# Patient Record
Sex: Female | Born: 1985 | Race: Black or African American | Hispanic: No | Marital: Single | Smoking: Former smoker
Health system: Southern US, Community
[De-identification: ages and names within clinical notes are randomized; demographics above are authoritative.]

## PROBLEM LIST (undated history)

## (undated) DIAGNOSIS — J189 Pneumonia, unspecified organism: Secondary | ICD-10-CM

## (undated) DIAGNOSIS — R87629 Unspecified abnormal cytological findings in specimens from vagina: Secondary | ICD-10-CM

## (undated) DIAGNOSIS — R519 Headache, unspecified: Secondary | ICD-10-CM

## (undated) DIAGNOSIS — D649 Anemia, unspecified: Secondary | ICD-10-CM

## (undated) DIAGNOSIS — M1990 Unspecified osteoarthritis, unspecified site: Secondary | ICD-10-CM

## (undated) HISTORY — DX: Anemia, unspecified: D64.9

## (undated) HISTORY — DX: Headache, unspecified: R51.9

## (undated) HISTORY — PX: KNEE SURGERY: SHX244

## (undated) HISTORY — DX: Unspecified abnormal cytological findings in specimens from vagina: R87.629

## (undated) HISTORY — DX: Pneumonia, unspecified organism: J18.9

## (undated) HISTORY — DX: Unspecified osteoarthritis, unspecified site: M19.90

---
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
## 2006-02-08 ENCOUNTER — Emergency Department (HOSPITAL_COMMUNITY): Admission: EM | Admit: 2006-02-08 | Discharge: 2006-02-09 | Payer: Self-pay | Admitting: Emergency Medicine

## 2006-03-31 ENCOUNTER — Emergency Department (HOSPITAL_COMMUNITY): Admission: EM | Admit: 2006-03-31 | Discharge: 2006-03-31 | Payer: Self-pay | Admitting: Emergency Medicine

## 2006-09-14 ENCOUNTER — Inpatient Hospital Stay (HOSPITAL_COMMUNITY): Admission: AD | Admit: 2006-09-14 | Discharge: 2006-09-15 | Payer: Self-pay | Admitting: Obstetrics & Gynecology

## 2006-09-16 ENCOUNTER — Inpatient Hospital Stay (HOSPITAL_COMMUNITY): Admission: AD | Admit: 2006-09-16 | Discharge: 2006-09-16 | Payer: Self-pay | Admitting: Obstetrics & Gynecology

## 2006-09-22 ENCOUNTER — Ambulatory Visit (HOSPITAL_COMMUNITY): Admission: AD | Admit: 2006-09-22 | Discharge: 2006-09-22 | Payer: Self-pay | Admitting: Obstetrics & Gynecology

## 2006-11-24 ENCOUNTER — Inpatient Hospital Stay (HOSPITAL_COMMUNITY): Admission: AD | Admit: 2006-11-24 | Discharge: 2006-11-24 | Payer: Self-pay | Admitting: Gynecology

## 2007-01-22 ENCOUNTER — Inpatient Hospital Stay (HOSPITAL_COMMUNITY): Admission: AD | Admit: 2007-01-22 | Discharge: 2007-01-22 | Payer: Self-pay | Admitting: Obstetrics

## 2007-04-28 ENCOUNTER — Inpatient Hospital Stay (HOSPITAL_COMMUNITY): Admission: AD | Admit: 2007-04-28 | Discharge: 2007-04-28 | Payer: Self-pay | Admitting: Obstetrics

## 2007-05-13 ENCOUNTER — Inpatient Hospital Stay (HOSPITAL_COMMUNITY): Admission: AD | Admit: 2007-05-13 | Discharge: 2007-05-15 | Payer: Self-pay | Admitting: Obstetrics

## 2008-03-24 ENCOUNTER — Emergency Department (HOSPITAL_COMMUNITY): Admission: EM | Admit: 2008-03-24 | Discharge: 2008-03-24 | Payer: Self-pay | Admitting: *Deleted

## 2008-03-25 ENCOUNTER — Emergency Department (HOSPITAL_COMMUNITY): Admission: EM | Admit: 2008-03-25 | Discharge: 2008-03-25 | Payer: Self-pay | Admitting: Emergency Medicine

## 2008-04-14 ENCOUNTER — Inpatient Hospital Stay (HOSPITAL_COMMUNITY): Admission: EM | Admit: 2008-04-14 | Discharge: 2008-04-19 | Payer: Self-pay | Admitting: *Deleted

## 2008-04-14 ENCOUNTER — Ambulatory Visit: Payer: Self-pay | Admitting: *Deleted

## 2008-04-14 ENCOUNTER — Emergency Department (HOSPITAL_COMMUNITY): Admission: EM | Admit: 2008-04-14 | Discharge: 2008-04-14 | Payer: Self-pay | Admitting: Emergency Medicine

## 2008-05-03 ENCOUNTER — Inpatient Hospital Stay (HOSPITAL_COMMUNITY): Admission: AD | Admit: 2008-05-03 | Discharge: 2008-05-03 | Payer: Self-pay | Admitting: Obstetrics & Gynecology

## 2008-07-11 ENCOUNTER — Ambulatory Visit (HOSPITAL_COMMUNITY): Admission: RE | Admit: 2008-07-11 | Discharge: 2008-07-11 | Payer: Self-pay | Admitting: Obstetrics

## 2008-09-18 ENCOUNTER — Inpatient Hospital Stay (HOSPITAL_COMMUNITY): Admission: AD | Admit: 2008-09-18 | Discharge: 2008-09-21 | Payer: Self-pay | Admitting: Obstetrics

## 2008-12-09 ENCOUNTER — Inpatient Hospital Stay (HOSPITAL_COMMUNITY): Admission: AD | Admit: 2008-12-09 | Discharge: 2008-12-12 | Payer: Self-pay | Admitting: *Deleted

## 2008-12-09 ENCOUNTER — Other Ambulatory Visit: Payer: Self-pay | Admitting: Emergency Medicine

## 2008-12-09 ENCOUNTER — Ambulatory Visit: Payer: Self-pay | Admitting: *Deleted

## 2009-08-08 IMAGING — US US OB DETAIL+14 WK
1 series · 14 of 28 positions shown · non-contrast
Comparison: none

OBSTETRICAL ULTRASOUND:
 This ultrasound exam was performed in the [HOSPITAL] Ultrasound Department.  The OB US report was generated in the AS system, and faxed to the ordering physician.  This report is also available in [REDACTED] PACS.

[Series 1: us ob detail +14 wk · 14 of 112 slices shown]
[im 5/112]
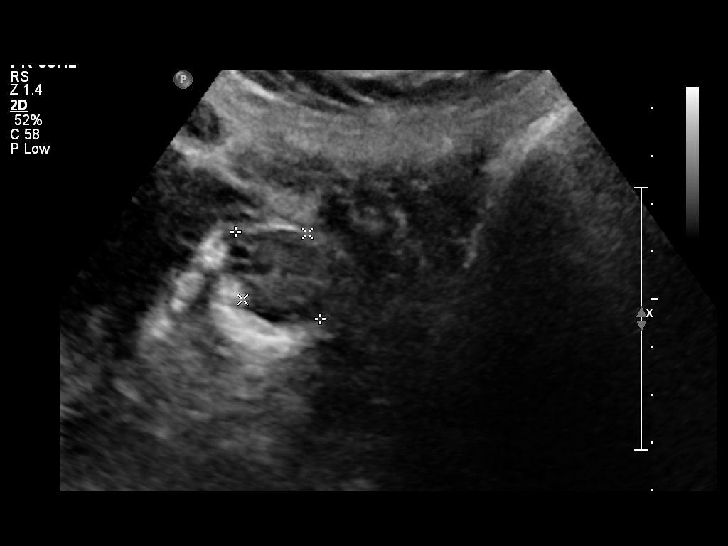
[im 13/112]
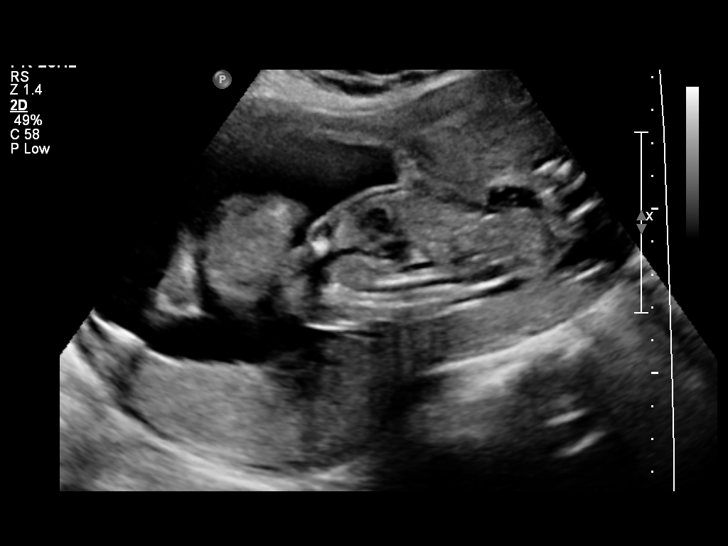
[im 21/112]
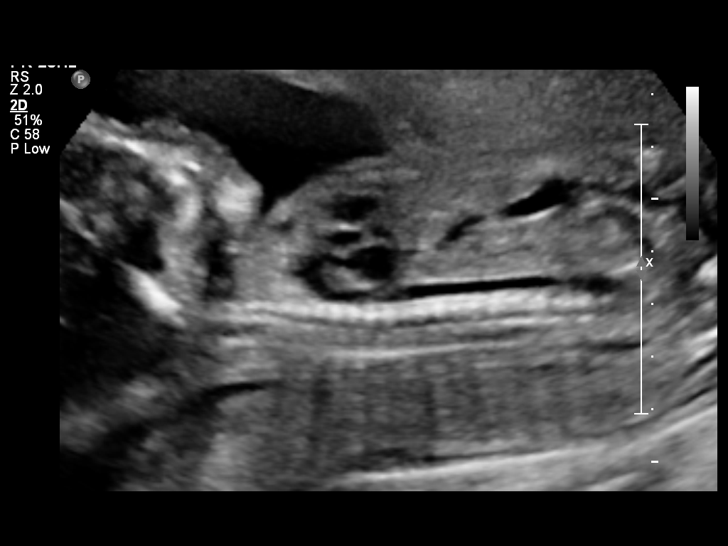
[im 29/112]
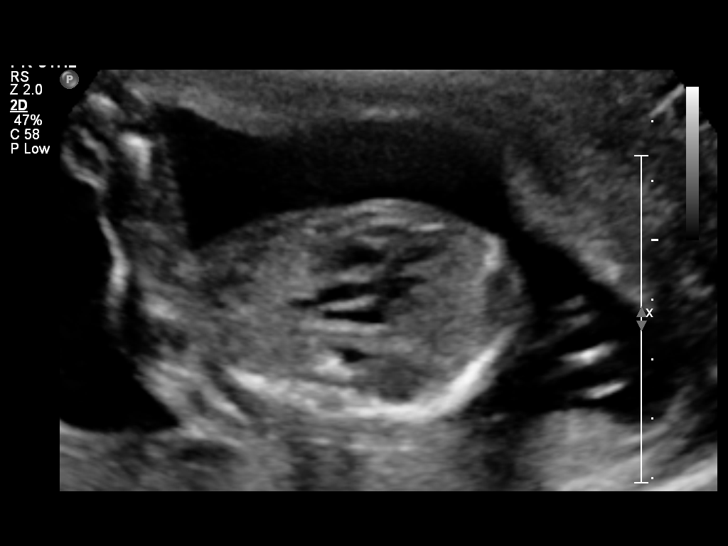
[im 38/112]
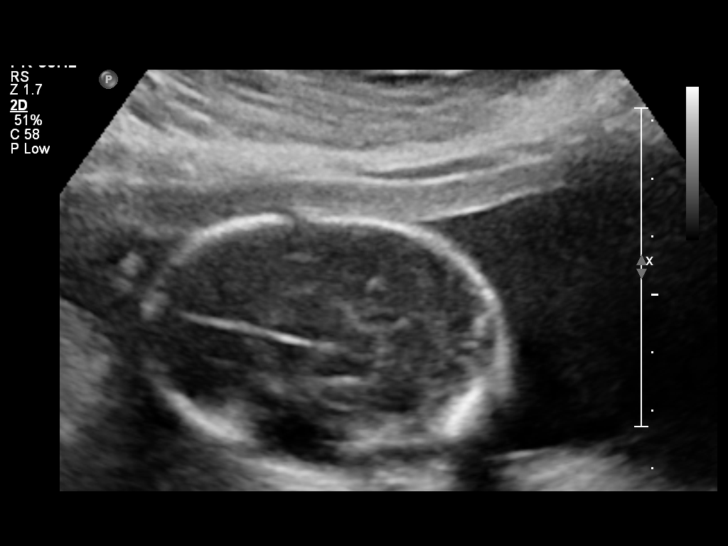
[im 46/112]
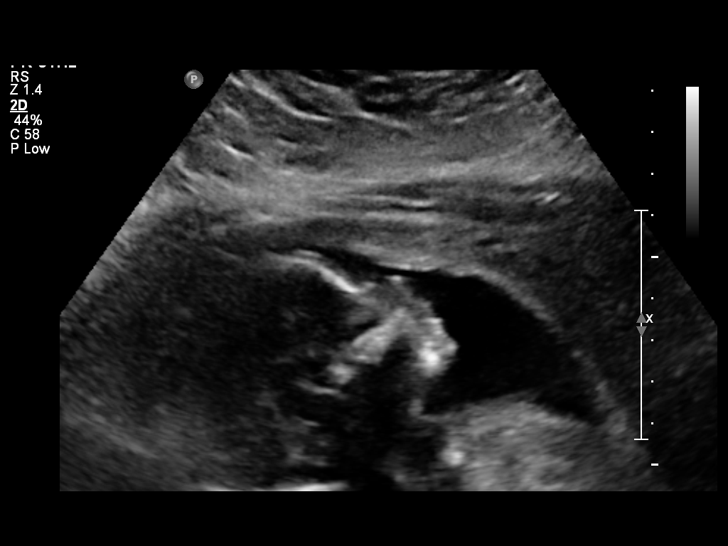
[im 54/112]
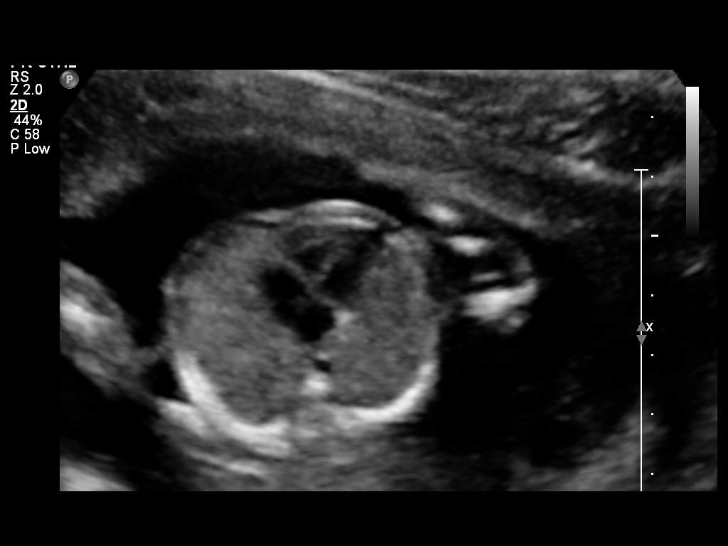
[im 62/112]
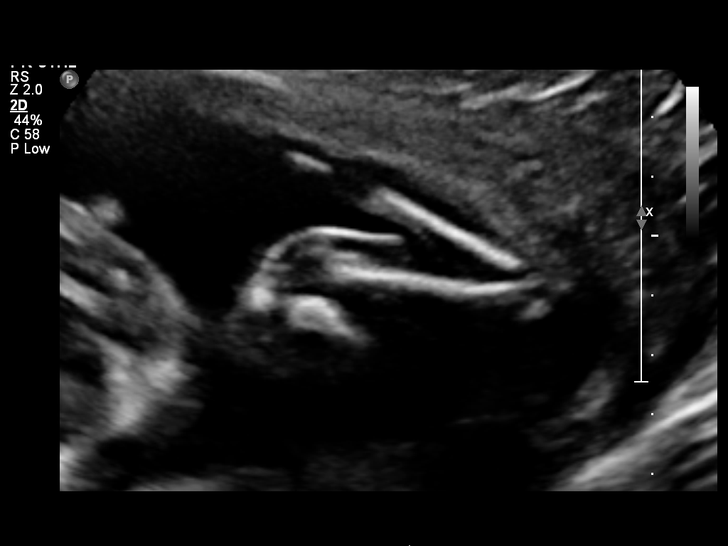
[im 70/112]
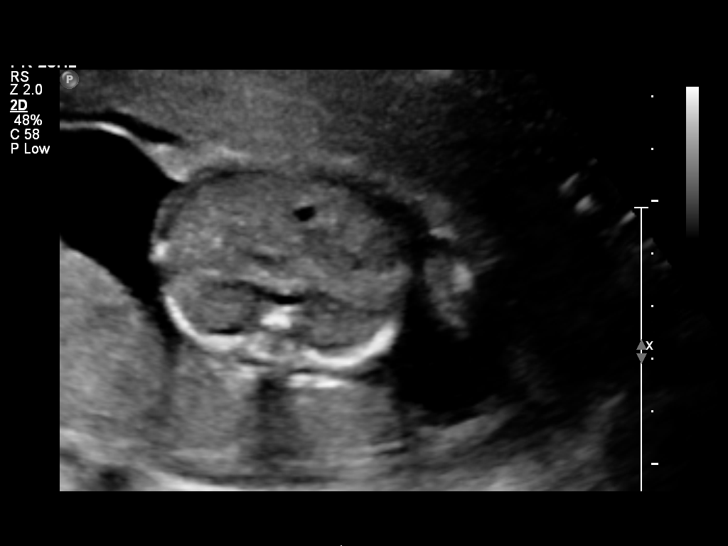
[im 79/112]
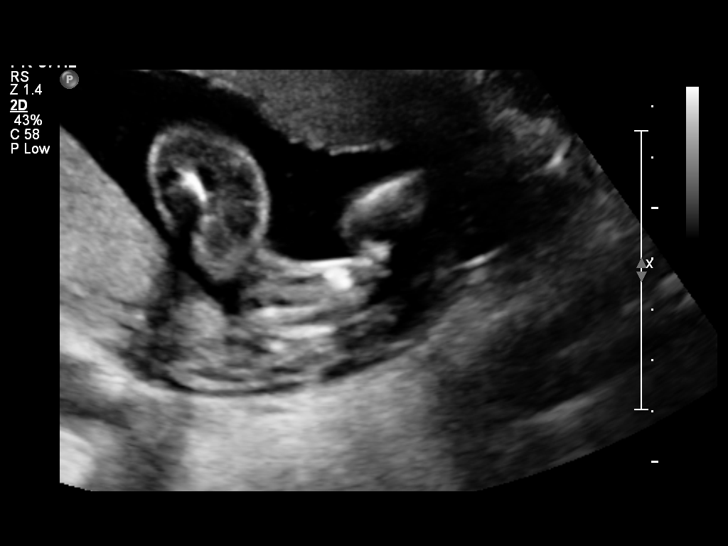
[im 87/112]
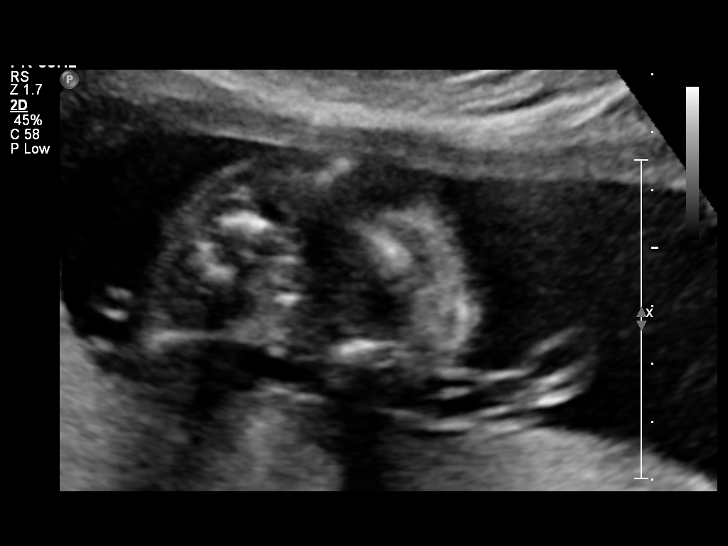
[im 95/112]
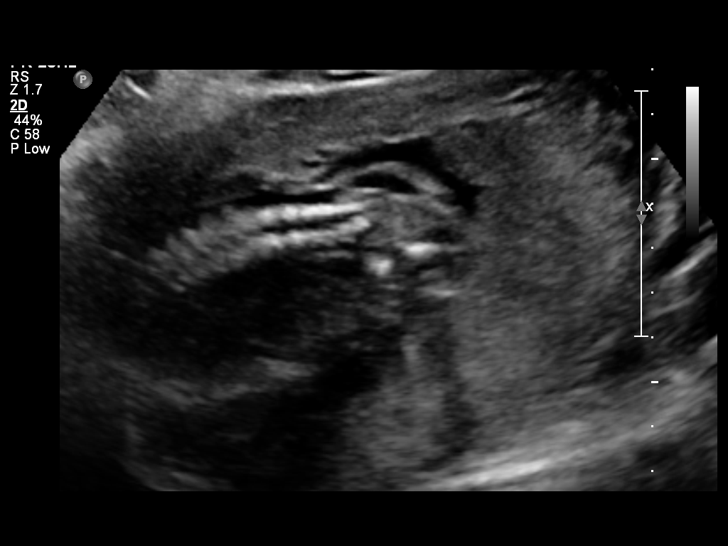
[im 103/112]
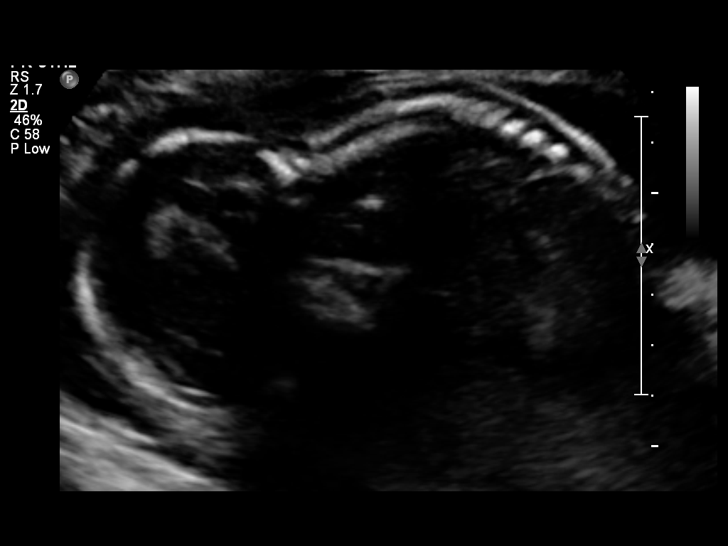
[im 112/112]
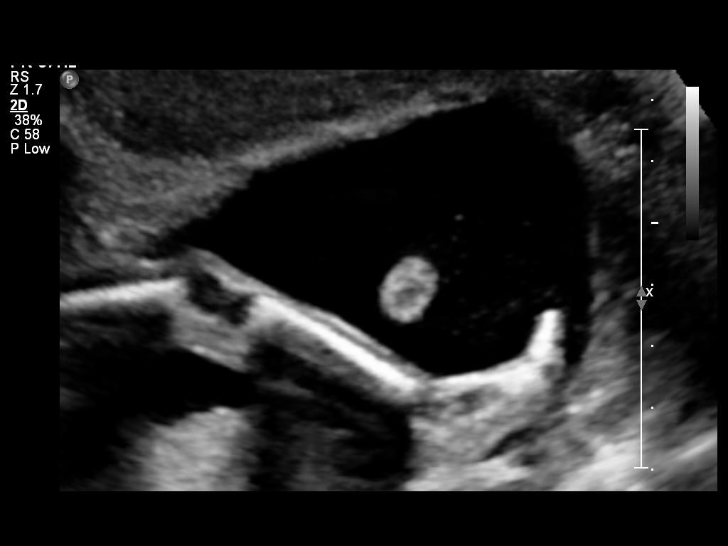

[14 of 28 positions shown; findings below may reference images not displayed]

IMPRESSION: See AS Obstetric US report.

## 2009-08-18 ENCOUNTER — Emergency Department (HOSPITAL_COMMUNITY): Admission: EM | Admit: 2009-08-18 | Discharge: 2009-08-18 | Payer: Self-pay | Admitting: Emergency Medicine

## 2009-11-20 ENCOUNTER — Emergency Department (HOSPITAL_COMMUNITY): Admission: EM | Admit: 2009-11-20 | Discharge: 2009-11-20 | Payer: Self-pay | Admitting: Emergency Medicine

## 2010-05-19 ENCOUNTER — Encounter: Payer: Self-pay | Admitting: Obstetrics

## 2010-07-16 LAB — URINALYSIS, ROUTINE W REFLEX MICROSCOPIC
Glucose, UA: NEGATIVE mg/dL
Hgb urine dipstick: NEGATIVE
Ketones, ur: 15 mg/dL — AB
Specific Gravity, Urine: 1.03 (ref 1.005–1.030)
Urobilinogen, UA: 0.2 mg/dL (ref 0.0–1.0)
pH: 6 (ref 5.0–8.0)

## 2010-07-16 LAB — WET PREP, GENITAL: Yeast Wet Prep HPF POC: NONE SEEN

## 2010-07-16 LAB — URINE MICROSCOPIC-ADD ON

## 2010-08-03 LAB — CBC
HCT: 40.4 % (ref 36.0–46.0)
MCHC: 34 g/dL (ref 30.0–36.0)
RDW: 14.9 % (ref 11.5–15.5)

## 2010-08-03 LAB — SALICYLATE LEVEL: Salicylate Lvl: <4 mg/dL (ref 2.8–20.0)

## 2010-08-03 LAB — RAPID URINE DRUG SCREEN, HOSP PERFORMED
Benzodiazepines: POSITIVE — AB
Cocaine: NOT DETECTED
Opiates: NOT DETECTED

## 2010-08-03 LAB — COMPREHENSIVE METABOLIC PANEL
ALT: 20 U/L (ref 0–35)
AST: 22 U/L (ref 0–37)
Alkaline Phosphatase: 78 U/L (ref 39–117)
BUN: 8 mg/dL (ref 6–23)
Calcium: 9.2 mg/dL (ref 8.4–10.5)
Chloride: 109 mEq/L (ref 96–112)
GFR calc Af Amer: >60 mL/min (ref >60)
GFR calc non Af Amer: >60 mL/min (ref >60)
Potassium: 4 mEq/L (ref 3.5–5.1)
Sodium: 138 mEq/L (ref 135–145)
Total Bilirubin: 0.7 mg/dL (ref 0.3–1.2)
Total Protein: 7.4 g/dL (ref 6.0–8.3)

## 2010-08-03 LAB — ACETAMINOPHEN LEVEL
Acetaminophen (Tylenol), Serum: 24.4 ug/mL (ref 10–30)
Acetaminophen (Tylenol), Serum: <10 ug/mL — ABNORMAL LOW (ref 10–30)

## 2010-08-06 LAB — RPR: RPR Ser Ql: NONREACTIVE

## 2010-08-06 LAB — CBC
Hemoglobin: 10.9 g/dL — ABNORMAL LOW (ref 12.0–15.0)
Hemoglobin: 12.7 g/dL (ref 12.0–15.0)
MCHC: 33.9 g/dL (ref 30.0–36.0)
Platelets: 165 10*3/uL (ref 150–400)
Platelets: 170 10*3/uL (ref 150–400)
WBC: 13.5 10*3/uL — ABNORMAL HIGH (ref 4.0–10.5)

## 2010-08-12 LAB — GC/CHLAMYDIA PROBE AMP, GENITAL
Chlamydia, DNA Probe: NEGATIVE
GC Probe Amp, Genital: NEGATIVE

## 2010-08-12 LAB — DIFFERENTIAL
Basophils Absolute: 0 10*3/uL (ref 0.0–0.1)
Basophils Relative: 0 % (ref 0–1)
Monocytes Absolute: 0.6 10*3/uL (ref 0.1–1.0)
Neutro Abs: 6.7 10*3/uL (ref 1.7–7.7)

## 2010-08-12 LAB — TYPE AND SCREEN: Antibody Screen: NEGATIVE

## 2010-08-12 LAB — URINALYSIS, ROUTINE W REFLEX MICROSCOPIC
Hgb urine dipstick: NEGATIVE
Ketones, ur: NEGATIVE mg/dL
Protein, ur: NEGATIVE mg/dL
Specific Gravity, Urine: 1.025 (ref 1.005–1.030)
Urobilinogen, UA: 1 mg/dL (ref 0.0–1.0)
pH: 6.5 (ref 5.0–8.0)

## 2010-08-12 LAB — URINE CULTURE: Colony Count: >=100000

## 2010-08-12 LAB — URINE MICROSCOPIC-ADD ON

## 2010-08-12 LAB — CBC
Hemoglobin: 12.1 g/dL (ref 12.0–15.0)
MCHC: 33.7 g/dL (ref 30.0–36.0)
RDW: 14.6 % (ref 11.5–15.5)

## 2010-08-12 LAB — RPR: RPR Ser Ql: NONREACTIVE

## 2010-08-12 LAB — WET PREP, GENITAL

## 2010-09-10 NOTE — H&P (Signed)
NAMEBRECKLYNN, JIAN             ACCOUNT NO.:  [id number]   MEDICAL RECORD NO.:  [id number]          PATIENT TYPE:  IPS   LOCATION:  0300                          FACILITY:  [facility]   PHYSICIAN:  Jasmine Pang, M.D. DATE OF BIRTH:  08/31/1985   DATE OF ADMISSION:  12/09/2008  DATE OF DISCHARGE:                       PSYCHIATRIC ADMISSION ASSESSMENT   IDENTIFYING INFORMATION/JUSTIFICATION FOR ADMISSION AND CARE:  This is a  25 year old single African American female.  The patient presented to  the ED at [facility] reporting that she was suicidal and had taken an  intentional overdose of Tylenol, 5 to 6 Tylenol's prior to coming to the  emergency department.  She also tried to inflict a laceration to her  right anterior wrist. This did not require any treatment other than  cleaning. She states that she and her mother had gotten into a verbal  altercation. She states that her mother told her she had 60 days to get  out and to take her children ages 52 months and 2 months with her. She  already receives in-house weekly counseling. This was due to the  depression noted after the birth of her infant daughter who is 68 months  old.   PAST PSYCHIATRIC HISTORY:  She was here with [place] once before 04/14/2009  to 04/19/2009. At that time, she had tried to strangle herself with a  belt. She had to be restrained. She felt hopeless after having an  argument on the phone with her baby's father and she was pregnant at  that time with the baby's due date of Sep 04, 2008.   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 She has never married.  Her children have different fathers.  She has a son who is 19 months and her daughter is now 2 months.  She  states that her family is watching her children and she receives  unemployment.   FAMILY HISTORY:  An uncle has alcohol issues.   MEDICAL HISTORY AND PRIMARY CARE PHYSICIAN:  She has no PCC. Her OB/GYN  is Dr.  Gaynell Face and she has no psychiatric follow-up. Dr. Gaynell Face  changed her from Prozac to Zoloft and she is currently prescribed Zoloft  50 mg p.o. daily.   DRUG ALLERGIES:  No known drug allergies.   PHYSICAL EXAMINATION:  GENERAL:  She is a well-developed, well-nourished  [place] female who appears her stated age of 50.  She is obese.  She is 5 foot 3 and weighs 284. VITAL SIGNS:  In the emergency room  showed that her temperature was 98.2 to 98.6.  Her pulse was 59-67,  respirations 18-20, blood pressure was 115/58 to 139/93.   LABORATORY DATA:  Her CBC had no abnormalities nor did her electrolytes.  She had no registered alcohol.  Her UDS was positive for benzodiazepines  as well as marijuana.   MENTAL STATUS EXAM:  Today she is calm.  She appears to have no acute  distress.  She was appropriately groomed and dressed. Her speech was  slow and soft.  Her mood was slightly anxious.  Her anxiety level was  felt to be bothered. Her thought processes were clear, rational and goal  oriented.  She has a plan to go live with her grandmother with the  children to get out of conflict with her mother and she wants to be able  to start school as scheduled on Tuesday.  Judgment and insight are  intact at this time.  Concentration and memory are present and  intelligence is at least average.  She specifically denies being  suicidal or homicidal.  She denies any auditory or visual  hallucinations.   DIAGNOSES:  AXIS I:  Substance-induced mood disorder, THC abuse.  AXIS II:  Rule out personality disorder.  AXIS III:  No known health problems other than obesity.  AXIS IV:  Issues with primary support group, education, occupational,  housing, economic issues.  AXIS V:  32.   PLAN:  Increase her Zoloft to 100 mg p.o. daily. We will have the case  manager to get a family session, hopefully with her mother and her  grandmother, as well as touching base with whoever her in-home therapist   is and she will be allowed to take her birth control pills from home.  She may need a substance abuse assessment.   ESTIMATED LENGTH OF STAY:  Is 2-3 days.      Mickie Leonarda Salon, P.A.-C.      Jasmine Pang, M.D.  Electronically Signed    MD/MEDQ  D:  12/10/2008  T:  12/10/2008  Job:  [id number]

## 2010-09-10 NOTE — H&P (Signed)
**Note Mckenzie Miller** Mckenzie Miller, SHIPP NO.:  [id number]   MEDICAL RECORD NO.:  [id number]          PATIENT TYPE:  IPS   LOCATION:  0304                          FACILITY:  [facility]   PHYSICIAN:  Jasmine Pang, M.D. DATE OF BIRTH:  Mar 22, 1986   DATE OF ADMISSION:  04/14/2008  DATE OF DISCHARGE:                       PSYCHIATRIC ADMISSION ASSESSMENT   IDENTIFYING INFORMATION:  This is a 25 year old African American female.  This is a voluntary admission.  Mckenzie Miller is single.   HISTORY OF THE PRESENT ILLNESS:  First [facility] admission and first inpatient  psychiatric admission for this 25 year old who presents after attempting  to strangle herself with a belt at home.  EMS was called.  Mckenzie Miller had to be  restrained.  Mckenzie Miller attempted to strangle herself using a belt in front of  her family members.  Today Mckenzie Miller says that Mckenzie Miller has been quite tearful and  having suicidal thoughts, felt hopeless after having an argument on the  phone with her baby's father.  Mckenzie Miller is in a first trimester of her  pregnancy, due Sep 04, 2008.  Mckenzie Miller also has an 46-month-old child at home  by a previous relationship.  Most recently, Mckenzie Miller fears that her baby's  father has been unfaithful and is not interested in her.  Mckenzie Miller loves him,  wants to continue the relationship.  He has been unclear on what his  feelings are about the relationship and the patient feels Mckenzie Miller may not be  able to go on without him.   PAST PSYCHIATRIC HISTORY:  First inpatient psychiatric admission.  Mckenzie Miller  received no current outpatient treatment.  Mckenzie Miller has a history of being  seen in the past for a period of time at [facility]  where they were evaluating her for bipolar disorder but Mckenzie Miller never  received a specific diagnosis.  Mckenzie Miller is not under any current care.  Denies previous trials of medications.  Mckenzie Miller denies ever having learning  disability or brain injury.  Says that Mckenzie Miller has tried to strangle or harm  herself several times in the past and did  experience some auditory  hallucinations as a teen.  Mckenzie Miller is currently using marijuana occasionally  but does not use it on a regular basis, last use was about a week ago.  Mckenzie Miller denies other substance abuse.   SOCIAL HISTORY:  Single female.  Mckenzie Miller is currently living with her mother  and her 34-month-old son.  Mckenzie Miller is enrolled in [employer] [facility] [employer] and  struggling to take her prerequisite courses.  Endorses a lot of  struggles in the relationship with her boyfriend, believes that he is  seeing someone else.  He in turn believes that Mckenzie Miller has cheated on him.  Wants to be a good mother to her 68-month-old, feels that her mother is  supportive.   FAMILY HISTORY:  Mckenzie Miller denies a family history of mental illness or  substance abuse.   MEDICAL HISTORY:   MEDICAL PROBLEMS:  1. Urinary tract infection diagnosed in the emergency room.  2. First trimester pregnancy.   PAST MEDICAL HISTORY:  Osteoarthritis of her right knee.   MEDICATIONS:  1. Prenatal vitamin 1 daily.  2. Mckenzie Miller has been prescribed Augmentin for her urinary tract infection      500 mg p.o. t.i.d. x7 days.   DRUG ALLERGIES:  NO KNOWN DRUG ALLERGIES.   DIAGNOSTIC STUDIES:  Remarkable for urinalysis positive for WBCs 21 to  50 per high-powered field and a urine drug screen positive for  marijuana.  Chemistry and CBC within normal limits.   MENTAL STATUS EXAM:  Fully alert female, cooperative.  Subdued mood.  Blunted affect.  Appears sad but oriented x4.  Cooperative.  Polite.  Speech is normal.  Gives a fairly coherent history.  Complicated  relationship with the boyfriend.  Insight is adequate.  Mood is  depressed.  Thought process logical, coherent, goal directed.  No  evidence of internal distractions or psychosis today.  No homicidal  thought.  Wants to maintain a relationship with the boyfriend but fears  that it is going to fail.  Immediate, recent, remote memory are intact.  Cognition fully intact.  AXIS I:  Depressive  disorder, NOS.  AXIS II:  Deferred.  AXIS III:  First trimester pregnancy, UTI.  AXIS IV:  Severe issues with relationship conflict.  AXIS V:  Current 44, past year not known.   PLAN:  Voluntarily admit her for stabilization.  We have to get a family  session with her boyfriend first hoping that they can talk things out  with the counselor and then possibly after that a family session with  her mother with whom Mckenzie Miller lives.  No medications at this time.  We will  continue to evaluate for the possible need for an antidepressant.      Margaret A. Lorin Picket, N.P.      Jasmine Pang, M.D.  Electronically Signed    MAS/MEDQ  D:  04/17/2008  T:  04/17/2008  Job:  [id number]

## 2010-09-10 NOTE — Discharge Summary (Signed)
Mckenzie Miller, Mckenzie Miller             ACCOUNT NO.:  [id number]   MEDICAL RECORD NO.:  [id number]          PATIENT TYPE:  IPS   LOCATION:  0300                          FACILITY:  [facility]   PHYSICIAN:  Jasmine Pang, M.D. DATE OF BIRTH:  03-May-1985   DATE OF ADMISSION:  12/09/2008  DATE OF DISCHARGE:  12/12/2008                               DISCHARGE SUMMARY   IDENTIFICATION:  This is a 25 year old single African American female  who was admitted on a voluntary basis on December 09, 2008.   HISTORY OF PRESENT ILLNESS:  The patient presented to the ED at [facility] reporting that she was suicidal and had taken an intentional  overdose of 5-6 Tylenol prior to coming to the emergency department.  She also tried to inflict a laceration to her right anterior wrist.  This did not require any treatment other than cleaning.  She states that  she and her mother at gotten into her verbal altercation.  She states  that her mother told her she had 60 days to get out of the house and  take her children, aged 24 months and 2 months.  She already received in-  house weekly counseling.  This was due to the depression noted after the  birth of her infant daughter who was 67 months old.  She was here with [place]  once before April 14, 2008, to April 19, 2008.  At that time, she  had tried to strangle herself with a belt.  She had to be restrained.  She felt hopeless after having an argument on the phone with her baby's  father and she was pregnant at the time with the baby's due date of Sep 04, 2008.  For further admission information, see psychiatric admission  assessment.  Initially, she was given an axis I diagnosis of substance-  induced mood disorder and THC abuse.  Her on axis III, she was given a  diagnosis of no health problems.   PHYSICAL FINDINGS:  There were no acute physical or medical problems  noted.  Her complete physical exam was done in the emergency department  and reviewed by our  PA.   LABORATORY DATA:  Her CBC had no abnormalities nor did her electrolyte.  She had no registered alcohol.  Her UDS was positive for benzodiazepines  as well as marijuana.   HOSPITAL COURSE:  Upon admission, the patient was continued on her own  birth control pills daily.  She was also started on Benadryl 50 mg p.o.  q.h.s.  In individual sessions, she was initially casually dressed with  fair eye contact.  There was psychomotor retardation.  Speech was soft  and slow.  Mood was depressed and anxious.  She denied suicidal  ideation.  There was no evidence of psychosis or thought disorder.  It  was decided to increase her Zoloft from 50 mg daily (her home dose) to  100 mg daily.  As hospitalization progressed, the patient had a family  session with her grandmother and grandfather.  They were very  supportive.  They stated that the patient's mother was most  likely not  to change her feelings or actions any time soon.  Although the patient's  grandparents wanted her to come and stay with them with her 2 children  as long as needed.  She agreed to this and felt comfortable.  Moving  in with them though she was somewhat worried that her mother was going  to react with anger.  On December 11, 2008, she was still having some  anxiety, though she was less depressed.  She was started on Neurontin  100 mg now then 100 mg q.4 h. p.r.n. anxiety and she continued to talk  about the conflict with her mother.  She was very upset about this, so  she was aware that her mother would probably not change.  She felt good  about the plans to go home to live with her grandparents and take her  children.  On December 12, 2008, mental status had improved.  She was  neatly and casually dressed with good eye contact.  Speech was normal  rate and flow.  Psychomotor activity was within normal limits.  Mood was  less depressed and less anxious.  Affect was consistent with mood.  There was no suicidal or homicidal  ideation.  No thoughts of self-  injurious behavior.  No auditory or visual hallucinations.  No paranoia  or delusions.  Thoughts were logical and goal directed.  Thought  content, no predominant theme.  Cognitive grossly intact.  Insight good.  Judgment good.  Impulse control good.  The patient wanted to go home  today and was felt to be safe for discharge.   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  Axis II:  None  Axis III:  No known health problems.  Axis IV:  Severe (issues with primary support group, education,  occupational, housing, and economic issues).  Axis V:  Global assessment of functioning was 50 upon discharge.  Global  assessment of functioning was 32 upon admission.  Global assessment of  functioning highest past year was 70.   DISCHARGE PLAN:  There were no specific activity level or dietary  restrictions.   POST HOSPITAL CARE PLAN:  The patient will go to the [facility] on  December 15, 2008, at 1:30 p.m.   DISCHARGE MEDICATIONS:  1. The patient is to continue her oral contraceptives as prescribed.  2. She is also on Zoloft 100 mg daily.  3. Ambien 10 mg at bedtime if needed for sleep.  4. Neurontin 100 mg every 4 hours as needed for anxiety.      Jasmine Pang, M.D.  Electronically Signed     BHS/MEDQ  D:  12/12/2008  T:  12/13/2008  Job:  [id number]

## 2010-09-13 NOTE — Discharge Summary (Signed)
Mckenzie Miller, Mckenzie Miller             ACCOUNT NO.:  [id number]   MEDICAL RECORD NO.:  [id number]          PATIENT TYPE:  IPS   LOCATION:  0304                          FACILITY:  [facility]   PHYSICIAN:  Jasmine Pang, M.D. DATE OF BIRTH:  12/04/1985   DATE OF ADMISSION:  04/14/2008  DATE OF DISCHARGE:  04/19/2008                               DISCHARGE SUMMARY   IDENTIFYING INFORMATION:  This is a 25 year old single African American  female who was admitted on a voluntary basis on April 14, 2008.   HISTORY OF PRESENT ILLNESS:  This is the first [facility] admission and first  inpatient psychiatric admission for this 25 year old, who presents after  attempting to strangle herself with a belt at home.  EMS was called.  She had to be restrained.  She attempted to strangle herself using a  belt in front of her family members.  Today, she says that she has been  quite tearful and having suicidal thoughts.  She felt hopeless after  having an argument on the phone with her baby's father.  She is in the  first trimester of her pregnancy, due Sep 04, 2008.  She also has a 74-  month-old child at home by a previous relationship.  Most recently, she  fears that her baby's father has been unfaithful and was not interested  in her.  She states she loves him and wants to continue the  relationship.  He has been unclear on what his feelings are about the  relationship and the patient states she may not be able to go on without  him.   PAST PSYCHIATRIC HISTORY:  This is the first psychiatric admission for  the patient.  She received no current outpatient treatment.  She has a  history of being seen in the past for a period of time at [facility] where they were evaluated her for bipolar  disorder, but she never received a diagnosis.  She is not under any  current care.  She denies previous trials of medications.  She denies  ever having this learning disability or brain injury.  She  says that she  is tried to strangle her or harm herself several times in the past and  did experience some auditory hallucinations as a teen.  She is currently  using marijuana occasionally, but does not use it on a regular basis.  Last use was about a week ago.  She denies other substance abuse.   FAMILY HISTORY:  The patient denies family history of mental illness or  substance abuse.   DRUG AND ALCOHOL HISTORY:  As above.  She is currently using marijuana  occasionally, but not on a regular basis.  She denies other substance  abuse.   MEDICAL PROBLEMS:  Urinary tract infection diagnosed in the emergency  room, first trimester pregnancy.   PAST MEDICAL HISTORY:  Osteoarthritis of the right knee.   MEDICATIONS:  1. Prenatal vitamins 1 daily.  2. She has been prescribed Augmentin for her urinary tract infection      500 mg p.o. t.i.d. for 7 days.  DRUG ALLERGIES:  No known drug allergies.   DIAGNOSTIC STUDIES:  Remarkable for urinalysis positive for WBCs of 21  to 50 per high-power field and urine drug screen was positive for  marijuana.  Chemistry panel was within normal limits.  CBC was within  normal limits.   HOSPITAL COURSE:  Upon admission, the patient was continued on Augmentin  500 mg p.o. t.i.d. and prenatal vitamins 1 daily.  She was also started  on Prozac 20 mg p.o. q.a.m. and Claritin 10 mg p.o. q.h.s.  She was  started on Monistat cream due to a probable yeast infection.  In  individual sessions, the patient was friendly and cooperative.  She was  depressed and tearful.  There was a psychomotor retardation.  She was  awaiting a family session with baby's father.  She wants to continue the  relationship with him, but fears he will reject her.  She had been  hearing angry voices telling her to hurt people, but no auditory  hallucinations since admission.  She denies homicidal ideation.  On  April 17, 2008, she was still depressed and anxious.  The Prozac was   started as indicated above.  Sleep was difficulty falling asleep.  The  patient was anxious to go home soon and being with her 66-month-old  child with her mother.  On April 19, 2008, the patient and the  patient's mother met for family session.  The patient stated that she  felt like the weight of the world was on her shoulders and she had just  not been able to cope anymore.  She states she has never acted on her  impulses to hurt herself in the past.  She states that while being here,  she has learned positive coping mechanisms for dealing with her stress  and depression.  She reports putting pressure on her family being  unemployed and having no income from her 46-month-old baby's father.  The patient's mother wanted her to discontinue the relationship with the  current baby's father.  The patient stated I should not be in this  relationship, but I am confused.  The patient's mother begged the  patient not to get pregnant again because she is providing for a number  of family members just on her income.  The patient's boyfriend did not  provide for them financially.  Mother would like to see the patient take  care of herself and her baby's, focus slowly on them and leave this man  alone.  The patient reported feeling ready for discharge.  Her mental  status had improved.  Mood was less depressed, less anxious.  Affect  consistent with mood.  There was no suicidal or homicidal ideation.  No  thoughts of self-injurious behavior.  No auditory or visual  hallucinations.  No paranoia or delusions.  Thoughts were logical and  goal-directed.  Thought content still ruminating about her baby's  father.  Cognitive was grossly intact.  Insight fair.  Judgment fair.  Impulse control good.  It was felt the patient was safe for discharge  today.   DISCHARGE DIAGNOSES:  Axis I:  Depressive disorder, not otherwise  specified.  Axis II:  None.  Axis III:  First trimester pregnancy, urinary tract  infection.  Axis IV:  Severe (issues with relationship, conflict of single mother  with no income, burden of psychiatric illness, burden of current  pregnancy).  Axis V:  Global assessment of functioning was 50 at the time of  discharge.  GAF was 44 upon admission.  GAF highest past year was 60.   DISCHARGE PLANS:  There was no specific activity level or dietary  restrictions.   POSTHOSPITAL CARE PLANS:  The patient was referred to [facility] on  January 6th at 1:30 p.m.  She is also referred to [facility] for  counseling.  She was also referred to [facility].  She is to see  her doctor as they advised.   DISCHARGE MEDICATIONS:  1. Prozac 20 mg daily.  2. Monistat application daily for 6 more days.  3. Augmentin 500 mg three times a day for 2 more days.      Jasmine Pang, M.D.  Electronically Signed     BHS/MEDQ  D:  05/23/2008  T:  05/24/2008  Job:  [id number]

## 2010-09-13 NOTE — Op Note (Signed)
[facility]  Patient:    Mckenzie Miller, Mckenzie Miller                    MRN: [id number] Proc. Date: 04/24/00 Adm. Date:  [id number] Attending:  Teena Dunk                           Operative Report  PREOPERATIVE DIAGNOSIS: 1. Lateral meniscal tear, right knee. 2. Osteoarthritis grade 3, medial compartment,.  POSTOPERATIVE DIAGNOSIS:  OPERATION PERFORMED: 1. Partial lateral meniscectomy. 2. Debridement chondroplasty medial compartment.  SURGEON:  Sharlot Gowda., M.D.  ANESTHESIA:  General.  DESCRIPTION OF PROCEDURE:  The patient was noted to have morbid obesity at age 25.  She had arthroscopy from a superiomedial, inferolateral portal. Systematic inspection of the knee showed that the patient had some mild softening of the articular surfaces of the patellofemoral joint.  There were some articular loose bodies removed from the knee with debridement with a shaver followed by inspection of the medial side of the knee which showed a rather extensive grade 3 lesion of the medial femoral condyle.  It was unusual that the lesion actually began more along the edge of the condyle abutting the meniscus and the more central or the more lateral portion of the condyle, the medial condyle was spared.  It was estimated by about 3 x 4 cm area was involved.  The meniscus itself was not torn medially fortunately and there was some corresponding chondromalacia just inside of the medial meniscus which was debrided and estimated to be early grade 2 and grade 3 changes.  Certainly some significant portion of the grade 3 lesion on the femur did not abut on the articular surface of the tibia proper.  The ACL PCL were normal.  The lateral compartment showed minor fissuring.  Small punctate tear of the posterior horn of the meniscus requiring radial tear required resection about 10 to 15% of the meniscal substance with a basket forceps, motorized  shaving instruments.  Debridement was carried out followed by draining the knee free of fluid.  Portals and joint infiltrated with Marcaine and 4 mg of morphine additional 10 cc for a total of 30 cc 0.5% Marcaine infiltrated in the knee and lightly compressive sterile dressing applied and taken to the recovery in stable condition. DD:  04/24/00 TD:  04/24/00 Job: [id number]

## 2011-01-16 LAB — CBC
HCT: 33.3 — ABNORMAL LOW
Hemoglobin: 11.3 — ABNORMAL LOW
MCHC: 34.1
MCV: 89.9
Platelets: 185
Platelets: 187
RDW: 14.2
WBC: 22.7 — ABNORMAL HIGH

## 2011-01-16 LAB — COMPREHENSIVE METABOLIC PANEL
ALT: 16
AST: 24
Albumin: 1.9 — ABNORMAL LOW
Albumin: 2.6 — ABNORMAL LOW
Alkaline Phosphatase: 137 — ABNORMAL HIGH
BUN: 5 — ABNORMAL LOW
Calcium: 9.6
Chloride: 106
Creatinine, Ser: 0.77
GFR calc Af Amer: >60
Glucose, Bld: 148 — ABNORMAL HIGH
Potassium: 3.9
Sodium: 134 — ABNORMAL LOW
Total Bilirubin: 0.5

## 2011-01-16 LAB — URIC ACID: Uric Acid, Serum: 5.7

## 2011-01-16 LAB — RPR: RPR Ser Ql: NONREACTIVE

## 2011-01-16 LAB — MAGNESIUM: Magnesium: 5 — ABNORMAL HIGH

## 2011-01-28 LAB — WOUND CULTURE

## 2011-01-31 LAB — RAPID URINE DRUG SCREEN, HOSP PERFORMED
Barbiturates: NOT DETECTED
Benzodiazepines: NOT DETECTED

## 2011-01-31 LAB — POCT I-STAT, CHEM 8
Chloride: 110 mEq/L (ref 96–112)
Creatinine, Ser: 0.9 mg/dL (ref 0.4–1.2)
HCT: 36 % (ref 36.0–46.0)
Hemoglobin: 12.2 g/dL (ref 12.0–15.0)
Potassium: 3.6 mEq/L (ref 3.5–5.1)
Sodium: 138 mEq/L (ref 135–145)

## 2011-01-31 LAB — COMPREHENSIVE METABOLIC PANEL
ALT: 18
AST: 20
Albumin: 2.3 — ABNORMAL LOW
Alkaline Phosphatase: 155 — ABNORMAL HIGH
CO2: 22
Chloride: 109
GFR calc Af Amer: >60
Potassium: 3.8
Total Bilirubin: 0.5

## 2011-01-31 LAB — CBC
Hemoglobin: 12 g/dL (ref 12.0–15.0)
MCHC: 33.5 g/dL (ref 30.0–36.0)
Platelets: 215 10*3/uL (ref 150–400)
Platelets: 191
RBC: 4.23
RDW: 14.1 % (ref 11.5–15.5)
WBC: 5.7

## 2011-01-31 LAB — URINE MICROSCOPIC-ADD ON

## 2011-01-31 LAB — URINALYSIS, ROUTINE W REFLEX MICROSCOPIC
Bilirubin Urine: NEGATIVE
Glucose, UA: NEGATIVE mg/dL
Hgb urine dipstick: NEGATIVE
Specific Gravity, Urine: 1.022 (ref 1.005–1.030)
Urobilinogen, UA: 1 mg/dL (ref 0.0–1.0)
pH: 7.5 (ref 5.0–8.0)

## 2011-01-31 LAB — DIFFERENTIAL
Basophils Absolute: 0.1 10*3/uL (ref 0.0–0.1)
Basophils Relative: 1 % (ref 0–1)
Lymphocytes Relative: 11 % — ABNORMAL LOW (ref 12–46)
Monocytes Absolute: 0.4 10*3/uL (ref 0.1–1.0)
Neutro Abs: 7.1 10*3/uL (ref 1.7–7.7)
Neutrophils Relative %: 82 % — ABNORMAL HIGH (ref 43–77)

## 2011-01-31 LAB — ETHANOL: Alcohol, Ethyl (B): <5 mg/dL (ref 0–10)

## 2011-01-31 LAB — PREGNANCY, URINE: Preg Test, Ur: POSITIVE

## 2011-02-06 LAB — URINALYSIS, ROUTINE W REFLEX MICROSCOPIC
Bilirubin Urine: NEGATIVE
Glucose, UA: NEGATIVE
Hgb urine dipstick: NEGATIVE
Ketones, ur: NEGATIVE
Nitrite: NEGATIVE
Protein, ur: NEGATIVE
Specific Gravity, Urine: 1.025
Urobilinogen, UA: 1
pH: 6.5

## 2011-02-06 LAB — WET PREP, GENITAL: Clue Cells Wet Prep HPF POC: NONE SEEN

## 2011-02-10 LAB — URINALYSIS, ROUTINE W REFLEX MICROSCOPIC
Bilirubin Urine: NEGATIVE
Nitrite: NEGATIVE
Specific Gravity, Urine: 1.025
Urobilinogen, UA: 1

## 2011-02-10 LAB — URINE MICROSCOPIC-ADD ON

## 2011-02-10 LAB — WET PREP, GENITAL

## 2011-08-06 ENCOUNTER — Emergency Department (INDEPENDENT_AMBULATORY_CARE_PROVIDER_SITE_OTHER)
Admission: EM | Admit: 2011-08-06 | Discharge: 2011-08-06 | Disposition: A | Discharge status: Home / Self Care | Payer: Self-pay | Source: Home / Self Care | Attending: Family Medicine | Admitting: Family Medicine

## 2011-08-06 ENCOUNTER — Emergency Department (INDEPENDENT_AMBULATORY_CARE_PROVIDER_SITE_OTHER): Payer: Self-pay

## 2011-08-06 ENCOUNTER — Encounter (HOSPITAL_COMMUNITY): Payer: Self-pay | Admitting: *Deleted

## 2011-08-06 DIAGNOSIS — S40012A Contusion of left shoulder, initial encounter: Secondary | ICD-10-CM

## 2011-08-06 DIAGNOSIS — S40019A Contusion of unspecified shoulder, initial encounter: Secondary | ICD-10-CM

## 2011-08-06 MED ORDER — TRAMADOL HCL 50 MG PO TABS: 50.0000 mg | ORAL_TABLET | Freq: Four times a day (QID) | ORAL | Status: AC | PRN

## 2011-08-06 MED ORDER — IBUPROFEN 600 MG PO TABS: 600.0000 mg | ORAL_TABLET | Freq: Three times a day (TID) | ORAL | Status: AC

## 2011-08-06 NOTE — ED Notes (Signed)
Pt slipped on wet floor fell hit left shoulder on floor remains with pain per pt unable to lift arm due to pain

## 2011-08-06 NOTE — Discharge Instructions (Signed)
There is no signs of fracture or bone injury injure her shoulder x-rays. Take the prescribed medications as instructed and started doing shoulder rehabilitation exercises once her pain improves. Followup with your primary care provider if persistent pain after 1 week or you can followup with the orthopedist Dr. Lurlean Leyden provided above.

## 2011-08-07 NOTE — ED Provider Notes (Signed)
History     CSN: [id number]  Arrival date & time 08/06/11  1442   First MD Initiated Contact with Patient 08/06/11 1609      Chief Complaint  Patient presents with  . Fall  . Shoulder Pain  . Shoulder Injury    (Consider location/radiation/quality/duration/timing/severity/associated sxs/prior treatment) HPI Comments: 26 y/o Obese female no significant PMH. Here c/o left shoulder pain after a fall yesterday. States she slipped on wet floor landing on her left shoulder. Pain diffuse affecting anterior and posterior shoulder, worse with arm elevation. Denies hitting her head or LOC. No seizures, syncope or palpitations.   Patient is a 26 y.o. female presenting with shoulder injury.  Shoulder Injury Pertinent negatives include no chest pain and no shortness of breath.    History reviewed. No pertinent past medical history.  History reviewed. No pertinent past surgical history.  History reviewed. No pertinent family history.  History  Substance Use Topics  . Smoking status: Current Everyday Smoker  . Smokeless tobacco: Not on file  . Alcohol Use: Yes    OB History    Grav Para Term Preterm Abortions TAB SAB Ect Mult Living                  Review of Systems  Constitutional: Negative for fever and chills.  HENT: Negative for facial swelling and neck pain.   Respiratory: Negative for cough and shortness of breath.   Cardiovascular: Negative for chest pain, palpitations and leg swelling.  Musculoskeletal: Negative for back pain.       Left shoulder pain as per HPI  Skin: Negative for rash.  All other systems reviewed and are negative.    Allergies  Review of patient's allergies indicates no known allergies.  Home Medications   Current Outpatient Rx  Name Route Sig Dispense Refill  . IBUPROFEN 600 MG PO TABS Oral Take 1 tablet (600 mg total) by mouth 3 (three) times daily. 30 tablet 0  . TRAMADOL HCL 50 MG PO TABS Oral Take 1 tablet (50 mg total) by mouth every 6  (six) hours as needed for pain. 15 tablet 0    BP 127/84  Pulse 89  Temp 98.7 F (37.1 C)  Resp 18  SpO2 99%  LMP 07/16/2011  Physical Exam  Nursing note and vitals reviewed. Constitutional: She is oriented to person, place, and time. She appears well-developed and well-nourished. No distress.  HENT:  Head: Normocephalic and atraumatic.  Right Ear: External ear normal.  Left Ear: External ear normal.  Eyes: EOM are normal. Pupils are equal, round, and reactive to light.  Neck: Normal range of motion. Neck supple.       Tenderness with palpation over left trapezium muscle. Negative Spurling test.  Cardiovascular: Normal rate, regular rhythm and normal heart sounds.   Pulmonary/Chest: Effort normal and breath sounds normal.  Musculoskeletal:       Left shoulder no obvious deformity. Clavicle appears intact. Tender to palpation diffusely. Focal pain with pal pation over [facility] joint as well. Pain with adduction and abduction. Able to supinate and pronate left arm with discomfort at 90 degrees. No bruising or focal tenderness over humerus. Left arm and hand neurovascularly intact.  Neurological: She is alert and oriented to person, place, and time. She has normal reflexes.  Skin: No rash noted.       No bruising, ecchymosis or lacerations.    ED Course  Procedures (including critical care time)  Labs Reviewed - No data to display  Dg Shoulder Left  08/06/2011  *RADIOLOGY REPORT*  Clinical Data: Larey Seat.  Pain.  LEFT SHOULDER - 2+ VIEW  Comparison: None.  Findings: The patient does not achieve much internal and external rotation.  The humeral head does not appear dislocated.  There is borderline widening of the [facility] joint, but this is not definitely pathologic.  No evidence of regional fracture.  IMPRESSION: No evidence of regional fracture.  Borderline widening of the [facility] joint.  Original Report Authenticated By: Thomasenia Sales, M.D.     1. Contusion of shoulder, left       MDM  No bone  fractures or dislocations. Treated with a sling, rest, NSAID and tramadol. Rehabilitation exercises reccommended once pain improves. Orthopedic referral as needed.        Sharin Grave, MD 08/08/11 1106

## 2014-08-25 ENCOUNTER — Emergency Department (HOSPITAL_COMMUNITY)
Admission: EM | Admit: 2014-08-25 | Discharge: 2014-08-25 | Disposition: A | Discharge status: Home / Self Care | Payer: Medicaid Other | Attending: Emergency Medicine | Admitting: Emergency Medicine

## 2014-08-25 ENCOUNTER — Emergency Department (HOSPITAL_COMMUNITY): Payer: Medicaid Other

## 2014-08-25 ENCOUNTER — Encounter (HOSPITAL_COMMUNITY): Payer: Self-pay | Admitting: Emergency Medicine

## 2014-08-25 DIAGNOSIS — Z720 Tobacco use: Secondary | ICD-10-CM | POA: Insufficient documentation

## 2014-08-25 DIAGNOSIS — R197 Diarrhea, unspecified: Secondary | ICD-10-CM | POA: Insufficient documentation

## 2014-08-25 DIAGNOSIS — J209 Acute bronchitis, unspecified: Secondary | ICD-10-CM | POA: Diagnosis not present

## 2014-08-25 DIAGNOSIS — Z3202 Encounter for pregnancy test, result negative: Secondary | ICD-10-CM | POA: Insufficient documentation

## 2014-08-25 DIAGNOSIS — J069 Acute upper respiratory infection, unspecified: Secondary | ICD-10-CM

## 2014-08-25 DIAGNOSIS — R05 Cough: Secondary | ICD-10-CM | POA: Diagnosis present

## 2014-08-25 DIAGNOSIS — R1111 Vomiting without nausea: Secondary | ICD-10-CM | POA: Diagnosis not present

## 2014-08-25 DIAGNOSIS — J40 Bronchitis, not specified as acute or chronic: Secondary | ICD-10-CM

## 2014-08-25 LAB — I-STAT CG4 LACTIC ACID, ED: Lactic Acid, Venous: 1.44 mmol/L (ref 0.5–2.0)

## 2014-08-25 LAB — I-STAT CHEM 8, ED
BUN: 4 mg/dL — ABNORMAL LOW (ref 6–23)
CALCIUM ION: 1.16 mmol/L (ref 1.12–1.23)
CHLORIDE: 105 mmol/L (ref 96–112)
Creatinine, Ser: 1 mg/dL (ref 0.50–1.10)
Glucose, Bld: 94 mg/dL (ref 70–99)
HEMATOCRIT: 45 % (ref 36.0–46.0)
HEMOGLOBIN: 15.3 g/dL — AB (ref 12.0–15.0)
Potassium: 3.8 mmol/L (ref 3.5–5.1)
SODIUM: 142 mmol/L (ref 135–145)
TCO2: 21 mmol/L (ref 0–100)

## 2014-08-25 LAB — POC URINE PREG, ED: Preg Test, Ur: NEGATIVE

## 2014-08-25 MED ORDER — DEXTROMETHORPHAN POLISTIREX ER 30 MG/5ML PO SUER
30.0000 mg | Freq: Once | ORAL | Status: AC
  Administered 2014-08-25: 30 mg via ORAL
  Filled 2014-08-25: qty 5

## 2014-08-25 MED ORDER — ACETAMINOPHEN 325 MG PO TABS: 650.0000 mg | ORAL_TABLET | Freq: Four times a day (QID) | ORAL | Status: DC | PRN

## 2014-08-25 MED ORDER — HYDROCODONE-HOMATROPINE 5-1.5 MG/5ML PO SYRP: 5.0000 mL | ORAL_SOLUTION | Freq: Four times a day (QID) | ORAL | Status: DC | PRN

## 2014-08-25 MED ORDER — AZITHROMYCIN 250 MG PO TABS
250.0000 mg | ORAL_TABLET | Freq: Every day | ORAL | Status: DC
Start: 2014-08-25 — End: 2020-01-28

## 2014-08-25 MED ORDER — ACETAMINOPHEN 325 MG PO TABS
ORAL_TABLET | ORAL | Status: AC
  Administered 2014-08-25: 325 mg
  Filled 2014-08-25: qty 2

## 2014-08-25 NOTE — ED Notes (Signed)
Patient transported to X-ray 

## 2014-08-25 NOTE — Discharge Instructions (Signed)
Take azithromycin as directed until gone. Take hycodan as needed for cough. Refer to attached documents for more information.  °

## 2014-08-25 NOTE — ED Provider Notes (Signed)
CSN: [id number]     Arrival date & time 08/25/14  1829 History   First MD Initiated Contact with Patient 08/25/14 2017     Chief Complaint  Patient presents with  . Nasal Congestion  . Cough  . Diarrhea     (Consider location/radiation/quality/duration/timing/severity/associated sxs/prior Treatment) Patient is a 29 y.o. female presenting with cough. The history is provided by the patient. No language interpreter was used.  Cough Cough characteristics:  Hacking Severity:  Moderate Onset quality:  Gradual Duration:  3 days Timing:  Constant Progression:  Worsening Chronicity:  New Smoker: yes   Context: sick contacts   Context: not animal exposure, not exposure to allergens, not fumes, not occupational exposure, not smoke exposure, not upper respiratory infection, not weather changes and not with activity   Relieved by:  Nothing Worsened by:  Nothing tried Ineffective treatments:  Decongestant and rest Associated symptoms: fever, rhinorrhea and sinus congestion   Associated symptoms: no chest pain, no chills and no shortness of breath   Risk factors: no chemical exposure, no recent infection and no recent travel     History reviewed. No pertinent past medical history. Past Surgical History  Procedure Laterality Date  . Knee surgery     No family history on file. History  Substance Use Topics  . Smoking status: Current Every Day Smoker  . Smokeless tobacco: Not on file  . Alcohol Use: Yes   OB History    No data available     Review of Systems  Constitutional: Positive for fever. Negative for chills and fatigue.  HENT: Positive for congestion and rhinorrhea. Negative for trouble swallowing.   Eyes: Negative for visual disturbance.  Respiratory: Positive for cough. Negative for shortness of breath.   Cardiovascular: Negative for chest pain and palpitations.  Gastrointestinal: Positive for vomiting and diarrhea. Negative for nausea and abdominal pain.  Genitourinary:  Negative for dysuria and difficulty urinating.  Musculoskeletal: Negative for arthralgias and neck pain.  Skin: Negative for color change.  Neurological: Negative for dizziness and weakness.  Psychiatric/Behavioral: Negative for dysphoric mood.      Allergies  Review of patient's allergies indicates no known allergies.  Home Medications   Prior to Admission medications   Not on File   BP 112/77 mmHg  Pulse 98  Temp(Src) 100.5 F (38.1 C)  Resp 18  Ht 5\' 5"  (1.651 m)  Wt 329 lb (149.233 kg)  BMI 54.75 kg/m2  SpO2 97%  LMP 08/21/2014 (Exact Date) Physical Exam  Constitutional: She is oriented to person, place, and time. She appears well-developed and well-nourished. No distress.  HENT:  Head: Normocephalic and atraumatic.  Mouth/Throat: Oropharynx is clear and moist. No oropharyngeal exudate.  Nasal congestion  Eyes: Conjunctivae and EOM are normal.  Neck: Normal range of motion.  Cardiovascular: Normal rate and regular rhythm.  Exam reveals no gallop and no friction rub.   No murmur heard. Pulmonary/Chest: Effort normal and breath sounds normal. She has no wheezes. She has no rales. She exhibits no tenderness.  Abdominal: Soft. She exhibits no distension. There is no tenderness. There is no rebound.  Musculoskeletal: Normal range of motion.  Neurological: She is alert and oriented to person, place, and time. Coordination normal.  Speech is goal-oriented. Moves limbs without ataxia.   Skin: Skin is warm and dry.  Psychiatric: She has a normal mood and affect. Her behavior is normal.  Nursing note and vitals reviewed.   ED Course  Procedures (including critical care time) Labs Review  Labs Reviewed  I-STAT CHEM 8, ED - Abnormal; Notable for the following:    BUN 4 (*)    Hemoglobin 15.3 (*)    All other components within normal limits  I-STAT CG4 LACTIC ACID, ED  POC URINE PREG, ED    Imaging Review Dg Chest 2 View  08/25/2014   CLINICAL DATA:  Cough.  Fever.   Vomiting.  Loss of appetite.  EXAM: CHEST  2 VIEW  COMPARISON:  None.  FINDINGS: Heart size is normal. Mediastinal shadows are normal. The lungs are clear. No bronchial thickening. No infiltrate, mass, effusion or collapse. Pulmonary vascularity is normal. No bony abnormality.  IMPRESSION: Normal   Electronically Signed   By: Paulina Fusi M.D.   On: 08/25/2014 21:23     EKG Interpretation None      MDM   Final diagnoses:  URI (upper respiratory infection)  Bronchitis    8:56 PM Labs unremarkable for acute changes. Chest xray pending. Patient is febrile on arrival. Patient given tylenol.   9:27 PM No pneumonia noted on chest xray. Patient likely has a URI and will be discharged with symptomatic treatment.   Emilia Beck, PA-C 08/25/14 2129  Geoffery Lyons, MD 08/25/14 2250

## 2014-08-25 NOTE — ED Notes (Signed)
C/o non-productive cough, nasal congestion, runny nose, itchy throat, and generalized weakness since Monday.  Reports vomiting after coughing since Wednesday and diarrhea that started today.

## 2016-12-24 ENCOUNTER — Encounter (HOSPITAL_COMMUNITY): Payer: Self-pay | Admitting: Emergency Medicine

## 2016-12-24 DIAGNOSIS — R51 Headache: Secondary | ICD-10-CM | POA: Insufficient documentation

## 2016-12-24 DIAGNOSIS — H9201 Otalgia, right ear: Secondary | ICD-10-CM | POA: Insufficient documentation

## 2016-12-24 DIAGNOSIS — K047 Periapical abscess without sinus: Secondary | ICD-10-CM | POA: Insufficient documentation

## 2016-12-24 DIAGNOSIS — L03211 Cellulitis of face: Secondary | ICD-10-CM | POA: Insufficient documentation

## 2016-12-24 DIAGNOSIS — K029 Dental caries, unspecified: Secondary | ICD-10-CM | POA: Insufficient documentation

## 2016-12-24 MED ORDER — IBUPROFEN 400 MG PO TABS
ORAL_TABLET | ORAL | Status: AC
  Filled 2016-12-24: qty 1

## 2016-12-24 MED ORDER — IBUPROFEN 400 MG PO TABS
400.0000 mg | ORAL_TABLET | Freq: Once | ORAL | Status: AC | PRN
  Administered 2016-12-24: 400 mg via ORAL

## 2016-12-24 NOTE — ED Triage Notes (Signed)
Reports swelling and pain to right side of face since Friday.  Unsure if it is related to a tooth or not.  Does have a tooth that is broken off on that side.

## 2016-12-25 ENCOUNTER — Emergency Department (HOSPITAL_COMMUNITY)
Admission: EM | Admit: 2016-12-25 | Discharge: 2016-12-25 | Disposition: A | Discharge status: Home / Self Care | Payer: Medicaid Other | Attending: Emergency Medicine | Admitting: Emergency Medicine

## 2016-12-25 DIAGNOSIS — K047 Periapical abscess without sinus: Secondary | ICD-10-CM

## 2016-12-25 DIAGNOSIS — L03211 Cellulitis of face: Secondary | ICD-10-CM

## 2016-12-25 MED ORDER — CLINDAMYCIN HCL 150 MG PO CAPS: 450.0000 mg | ORAL_CAPSULE | Freq: Three times a day (TID) | ORAL | 0 refills | Status: DC

## 2016-12-25 MED ORDER — CLINDAMYCIN HCL 150 MG PO CAPS
600.0000 mg | ORAL_CAPSULE | Freq: Once | ORAL | Status: AC
  Administered 2016-12-25: 600 mg via ORAL
  Filled 2016-12-25: qty 4

## 2016-12-25 MED ORDER — OXYCODONE HCL 5 MG PO TABS
10.0000 mg | ORAL_TABLET | Freq: Once | ORAL | Status: AC
  Administered 2016-12-25: 10 mg via ORAL
  Filled 2016-12-25: qty 2

## 2016-12-25 MED ORDER — OXYCODONE HCL 5 MG PO TABS: 5.0000 mg | ORAL_TABLET | Freq: Four times a day (QID) | ORAL | 0 refills | Status: DC | PRN

## 2016-12-25 NOTE — ED Notes (Signed)
Patient is A&Ox4.  No signs of distress noted.  Please see providers complete history and physical exam.  

## 2016-12-25 NOTE — ED Provider Notes (Signed)
[facility]-EMERGENCY DEPT Provider Note   CSN: [id number] Arrival date & time: 12/24/16  2201     History   Chief Complaint Chief Complaint  Patient presents with  . Facial Swelling    HPI Mckenzie Miller is a 31 y.o. female with Her medical problems presents to the Emergency Department complaining of gradual, persistent, progressively worsening right upper dental pain with associated right-sided facial swelling onset approximately 24 hours ago. Patient reports that her right eye was swollen earlier today but this has improved. No treatments prior to arrival. Patient denies fevers or chills, nausea or vomiting, feeling of throat closing, difficulty swallowing. Patient reports been many years since she seen a dentist. No history of immunocompromise, diabetes or HIV.  Patient reports eating and drinking makes her symptoms worse.  The history is provided by the patient and medical records. No language interpreter was used.    History reviewed. No pertinent past medical history.  There are no active problems to display for this patient.   Past Surgical History:  Procedure Laterality Date  . KNEE SURGERY      OB History    No data available       Home Medications    Prior to Admission medications   Medication Sig Start Date End Date Taking? Authorizing Provider  azithromycin (ZITHROMAX Z-PAK) 250 MG tablet Take 1 tablet (250 mg total) by mouth daily. 500mg  PO day 1, then 250mg  PO days 205 08/25/14   Szekalski, Kaitlyn, PA-C  clindamycin (CLEOCIN) 150 MG capsule Take 3 capsules (450 mg total) by mouth 3 (three) times daily. 12/25/16   Yareni Creps, Dahlia Client, PA-C  HYDROcodone-homatropine (HYCODAN) 5-1.5 MG/5ML syrup Take 5 mLs by mouth every 6 (six) hours as needed. 08/25/14   Emilia Beck, PA-C  ibuprofen (ADVIL,MOTRIN) 200 MG tablet Take 400 mg by mouth every 6 (six) hours as needed for moderate pain.    [provider]  oxyCODONE (ROXICODONE) 5 MG immediate release  tablet Take 1 tablet (5 mg total) by mouth every 6 (six) hours as needed for severe pain. 12/25/16   Alfie Alderfer, Dahlia Client, PA-C  Phenylephrine-DM-GG-APAP 5-10-200-325 MG TABS Take 2 tablets by mouth 2 (two) times daily as needed (for cold).    [provider]    Family History No family history on file.  Social History Social History  Substance Use Topics  . Smoking status: Current Every Day Smoker  . Smokeless tobacco: Never Used  . Alcohol use Yes     Allergies   Sulfa antibiotics and Sulfites   Review of Systems Review of Systems  Constitutional: Negative for appetite change, chills and fever.  HENT: Positive for dental problem, ear pain ( right) and facial swelling. Negative for drooling, nosebleeds, postnasal drip, rhinorrhea and trouble swallowing.   Eyes: Negative for pain and redness.  Respiratory: Negative for cough and wheezing.   Cardiovascular: Negative for chest pain.  Gastrointestinal: Negative for abdominal pain, nausea and vomiting.  Musculoskeletal: Negative for neck pain and neck stiffness.  Skin: Negative for color change and rash.  Neurological: Positive for headaches ( frontal). Negative for weakness and light-headedness.  All other systems reviewed and are negative.    Physical Exam Updated Vital Signs BP (!) 135/104 (BP Location: Left Arm)   Pulse 79   Temp 98.6 F (37 C) (Oral)   Resp 18   Ht 5\' 5"  (1.651 m)   Wt (!) 152.2 kg (335 lb 9 oz)   LMP 12/17/2016   SpO2 97%   BMI  55.84 kg/m   Physical Exam  Constitutional: She appears well-developed and well-nourished.  HENT:  Head: Normocephalic.  Right Ear: Tympanic membrane, external ear and ear canal normal.  Left Ear: Tympanic membrane, external ear and ear canal normal.  Nose: Nose normal. Right sinus exhibits no maxillary sinus tenderness and no frontal sinus tenderness. Left sinus exhibits no maxillary sinus tenderness and no frontal sinus tenderness.  Mouth/Throat: Uvula is  midline, oropharynx is clear and moist and mucous membranes are normal. No oral lesions. Abnormal dentition. Dental caries present. No uvula swelling or lacerations. No oropharyngeal exudate, posterior oropharyngeal edema, posterior oropharyngeal erythema or tonsillar abscesses.  Right upper molar with TTP and gingival erythema No gross abscess No fluctuance or induration to the buccal mucosa or floor of the mouth Mild swelling of the right upper face with mild erythema  Eyes: Pupils are equal, round, and reactive to light. Conjunctivae are normal. Right eye exhibits no discharge. Left eye exhibits no discharge.  Neck: Normal range of motion. Neck supple.  No stridor Handling secretions without difficulty No nuchal rigidity No cervical lymphadenopathy  Cardiovascular: Normal rate, regular rhythm and normal heart sounds.   Pulmonary/Chest: Effort normal. No respiratory distress.  Equal chest rise  Abdominal: Soft. Bowel sounds are normal. She exhibits no distension. There is no tenderness.  Lymphadenopathy:    She has no cervical adenopathy.  Neurological: She is alert.  Skin: Skin is warm and dry.  Psychiatric: She has a normal mood and affect.  Nursing note and vitals reviewed.    ED Treatments / Results   Procedures Procedures (including critical care time)  Medications Ordered in ED Medications  clindamycin (CLEOCIN) capsule 600 mg (not administered)  oxyCODONE (Oxy IR/ROXICODONE) immediate release tablet 10 mg (not administered)  ibuprofen (ADVIL,MOTRIN) tablet 400 mg (400 mg Oral Given 12/24/16 2219)     Initial Impression / Assessment and Plan / ED Course  I have reviewed the triage vital signs and the nursing notes.  Pertinent labs & imaging results that were available during my care of the patient were reviewed by me and considered in my medical decision making (see chart for details).     Patient with toothache.  Dental infection with spread to the soft tissue of  the face.  No gross abscess.  Exam unconcerning for Ludwig's angina or spread of infection.  Pt is afebrile.  No evidence of sepsis.  Will treat with Clindamycin and anti-inflammatories.  Urged patient to follow-up with dentist.     Final Clinical Impressions(s) / ED Diagnoses   Final diagnoses:  Dental infection  Facial cellulitis    New Prescriptions New Prescriptions   CLINDAMYCIN (CLEOCIN) 150 MG CAPSULE    Take 3 capsules (450 mg total) by mouth 3 (three) times daily.   OXYCODONE (ROXICODONE) 5 MG IMMEDIATE RELEASE TABLET    Take 1 tablet (5 mg total) by mouth every 6 (six) hours as needed for severe pain.     Willean Schurman, Boyd KerbsHannah, PA-C 12/25/16 0426    Ward, Layla MawKristen N, DO 12/25/16 [phone]

## 2016-12-25 NOTE — Discharge Instructions (Signed)
1. Medications: roxicodone, Clindamycin, usual home medications 2. Treatment: rest, drink plenty of fluids, take medications as prescribed 3. Follow Up: Please followup with dentistry within 1 week for discussion of your diagnoses and further evaluation after today's visit; if you do not have a primary care doctor use the resource guide provided to find one; Return to the ER for high fevers, difficulty breathing, difficulty swallowing or other concerning symptoms

## 2016-12-25 NOTE — ED Notes (Signed)
Pt states throbbing on the right side of the face and under the eyelid unsure if the tooth is what is causing this

## 2016-12-25 NOTE — ED Notes (Signed)
Pt departed in NAD, refused use of wheelchair.  

## 2020-01-22 ENCOUNTER — Emergency Department (HOSPITAL_COMMUNITY): Payer: HRSA Program

## 2020-01-22 ENCOUNTER — Encounter (HOSPITAL_COMMUNITY): Payer: Self-pay | Admitting: Emergency Medicine

## 2020-01-22 ENCOUNTER — Other Ambulatory Visit: Payer: Self-pay

## 2020-01-22 ENCOUNTER — Inpatient Hospital Stay (HOSPITAL_COMMUNITY)
Admission: EM | Admit: 2020-01-22 | Discharge: 2020-01-28 | DRG: 177 | Disposition: A | Discharge status: Home Health | Payer: HRSA Program | Attending: Internal Medicine | Admitting: Internal Medicine

## 2020-01-22 DIAGNOSIS — R7401 Elevation of levels of liver transaminase levels: Secondary | ICD-10-CM | POA: Diagnosis present

## 2020-01-22 DIAGNOSIS — R739 Hyperglycemia, unspecified: Secondary | ICD-10-CM

## 2020-01-22 DIAGNOSIS — Z87891 Personal history of nicotine dependence: Secondary | ICD-10-CM | POA: Diagnosis not present

## 2020-01-22 DIAGNOSIS — J9600 Acute respiratory failure, unspecified whether with hypoxia or hypercapnia: Secondary | ICD-10-CM | POA: Diagnosis not present

## 2020-01-22 DIAGNOSIS — T380X5A Adverse effect of glucocorticoids and synthetic analogues, initial encounter: Secondary | ICD-10-CM | POA: Diagnosis not present

## 2020-01-22 DIAGNOSIS — U071 COVID-19: Secondary | ICD-10-CM | POA: Diagnosis present

## 2020-01-22 DIAGNOSIS — E876 Hypokalemia: Secondary | ICD-10-CM | POA: Diagnosis present

## 2020-01-22 DIAGNOSIS — Z833 Family history of diabetes mellitus: Secondary | ICD-10-CM | POA: Diagnosis not present

## 2020-01-22 DIAGNOSIS — Z6841 Body Mass Index (BMI) 40.0 and over, adult: Secondary | ICD-10-CM | POA: Diagnosis not present

## 2020-01-22 DIAGNOSIS — R0902 Hypoxemia: Secondary | ICD-10-CM

## 2020-01-22 DIAGNOSIS — R7989 Other specified abnormal findings of blood chemistry: Secondary | ICD-10-CM | POA: Diagnosis present

## 2020-01-22 DIAGNOSIS — J9690 Respiratory failure, unspecified, unspecified whether with hypoxia or hypercapnia: Secondary | ICD-10-CM | POA: Diagnosis not present

## 2020-01-22 DIAGNOSIS — Z283 Underimmunization status: Secondary | ICD-10-CM | POA: Diagnosis not present

## 2020-01-22 DIAGNOSIS — J9601 Acute respiratory failure with hypoxia: Secondary | ICD-10-CM | POA: Diagnosis present

## 2020-01-22 DIAGNOSIS — R042 Hemoptysis: Secondary | ICD-10-CM | POA: Diagnosis present

## 2020-01-22 DIAGNOSIS — E1165 Type 2 diabetes mellitus with hyperglycemia: Secondary | ICD-10-CM | POA: Diagnosis not present

## 2020-01-22 DIAGNOSIS — R0602 Shortness of breath: Secondary | ICD-10-CM

## 2020-01-22 DIAGNOSIS — E119 Type 2 diabetes mellitus without complications: Secondary | ICD-10-CM | POA: Diagnosis not present

## 2020-01-22 DIAGNOSIS — J069 Acute upper respiratory infection, unspecified: Secondary | ICD-10-CM | POA: Diagnosis not present

## 2020-01-22 DIAGNOSIS — D649 Anemia, unspecified: Secondary | ICD-10-CM | POA: Diagnosis present

## 2020-01-22 DIAGNOSIS — J1282 Pneumonia due to coronavirus disease 2019: Secondary | ICD-10-CM | POA: Diagnosis present

## 2020-01-22 LAB — CBC
HCT: 41.1 % (ref 36.0–46.0)
Hemoglobin: 13.3 g/dL (ref 12.0–15.0)
MCH: 28.9 pg (ref 26.0–34.0)
MCHC: 32.4 g/dL (ref 30.0–36.0)
MCV: 89.3 fL (ref 80.0–100.0)
Platelets: 285 10*3/uL (ref 150–400)
RBC: 4.6 MIL/uL (ref 3.87–5.11)
RDW: 13.2 % (ref 11.5–15.5)
WBC: 6.1 10*3/uL (ref 4.0–10.5)
nRBC: 0 % (ref 0.0–0.2)

## 2020-01-22 LAB — TRIGLYCERIDES: Triglycerides: 156 mg/dL — ABNORMAL HIGH (ref <150)

## 2020-01-22 LAB — PROCALCITONIN: Procalcitonin: 0.13 ng/mL

## 2020-01-22 LAB — BASIC METABOLIC PANEL
Anion gap: 18 — ABNORMAL HIGH (ref 5–15)
BUN: <5 mg/dL — ABNORMAL LOW (ref 6–20)
CO2: 24 mmol/L (ref 22–32)
Calcium: 8.7 mg/dL — ABNORMAL LOW (ref 8.9–10.3)
Chloride: 97 mmol/L — ABNORMAL LOW (ref 98–111)
Creatinine, Ser: 0.96 mg/dL (ref 0.44–1.00)
GFR calc Af Amer: >60 mL/min (ref >60)
GFR calc non Af Amer: >60 mL/min (ref >60)
Glucose, Bld: 189 mg/dL — ABNORMAL HIGH (ref 70–99)
Potassium: 3.3 mmol/L — ABNORMAL LOW (ref 3.5–5.1)
Sodium: 139 mmol/L (ref 135–145)

## 2020-01-22 LAB — I-STAT BETA HCG BLOOD, ED (MC, WL, AP ONLY)
I-stat hCG, quantitative: <5 m[IU]/mL (ref <5)
I-stat hCG, quantitative: <5 m[IU]/mL (ref <5)

## 2020-01-22 LAB — HEPATIC FUNCTION PANEL
ALT: 240 U/L — ABNORMAL HIGH (ref 0–44)
AST: 138 U/L — ABNORMAL HIGH (ref 15–41)
Albumin: 3 g/dL — ABNORMAL LOW (ref 3.5–5.0)
Alkaline Phosphatase: 75 U/L (ref 38–126)
Bilirubin, Direct: 0.5 mg/dL — ABNORMAL HIGH (ref 0.0–0.2)
Indirect Bilirubin: 1 mg/dL — ABNORMAL HIGH (ref 0.3–0.9)
Total Bilirubin: 1.5 mg/dL — ABNORMAL HIGH (ref 0.3–1.2)
Total Protein: 7 g/dL (ref 6.5–8.1)

## 2020-01-22 LAB — FIBRINOGEN: Fibrinogen: 413 mg/dL (ref 210–475)

## 2020-01-22 LAB — C-REACTIVE PROTEIN: CRP: 9.8 mg/dL — ABNORMAL HIGH (ref <1.0)

## 2020-01-22 LAB — LACTIC ACID, PLASMA: Lactic Acid, Venous: 1.7 mmol/L (ref 0.5–1.9)

## 2020-01-22 LAB — FERRITIN: Ferritin: 524 ng/mL — ABNORMAL HIGH (ref 11–307)

## 2020-01-22 LAB — LACTATE DEHYDROGENASE: LDH: 731 U/L — ABNORMAL HIGH (ref 98–192)

## 2020-01-22 LAB — SARS CORONAVIRUS 2 BY RT PCR (HOSPITAL ORDER, PERFORMED IN ~~LOC~~ HOSPITAL LAB): SARS Coronavirus 2: NEGATIVE

## 2020-01-22 LAB — D-DIMER, QUANTITATIVE: D-Dimer, Quant: 1.02 ug/mL-FEU — ABNORMAL HIGH (ref 0.00–0.50)

## 2020-01-22 MED ORDER — ENOXAPARIN SODIUM 80 MG/0.8ML ~~LOC~~ SOLN
75.0000 mg | SUBCUTANEOUS | Status: DC
  Filled 2020-01-22: qty 0.75

## 2020-01-22 MED ORDER — METHYLPREDNISOLONE SODIUM SUCC 125 MG IJ SOLR: 75.0000 mg | Freq: Once | INTRAMUSCULAR | Status: DC

## 2020-01-22 MED ORDER — ONDANSETRON HCL 4 MG PO TABS: 4.0000 mg | ORAL_TABLET | Freq: Four times a day (QID) | ORAL | Status: DC | PRN

## 2020-01-22 MED ORDER — ACETAMINOPHEN 500 MG PO TABS
1000.0000 mg | ORAL_TABLET | Freq: Once | ORAL | Status: AC
  Administered 2020-01-22: 1000 mg via ORAL
  Filled 2020-01-22: qty 2

## 2020-01-22 MED ORDER — ACETAMINOPHEN 650 MG RE SUPP: 650.0000 mg | Freq: Four times a day (QID) | RECTAL | Status: DC | PRN

## 2020-01-22 MED ORDER — ACETAMINOPHEN 325 MG PO TABS
650.0000 mg | ORAL_TABLET | Freq: Four times a day (QID) | ORAL | Status: DC | PRN
  Administered 2020-01-24 – 2020-01-26 (×3): 650 mg via ORAL
  Filled 2020-01-22 (×3): qty 2

## 2020-01-22 MED ORDER — ALBUTEROL SULFATE HFA 108 (90 BASE) MCG/ACT IN AERS
2.0000 | INHALATION_SPRAY | Freq: Once | RESPIRATORY_TRACT | Status: AC
  Administered 2020-01-22: 20:00:00 2 via RESPIRATORY_TRACT
  Filled 2020-01-22: qty 6.7

## 2020-01-22 MED ORDER — ONDANSETRON HCL 4 MG/2ML IJ SOLN: 4.0000 mg | Freq: Four times a day (QID) | INTRAMUSCULAR | Status: DC | PRN

## 2020-01-22 MED ORDER — METHYLPREDNISOLONE SODIUM SUCC 125 MG IJ SOLR: 60.0000 mg | Freq: Once | INTRAMUSCULAR | Status: DC

## 2020-01-22 MED ORDER — SODIUM CHLORIDE 0.9% FLUSH: 3.0000 mL | Freq: Once | INTRAVENOUS | Status: DC

## 2020-01-22 MED ORDER — SODIUM CHLORIDE 0.9 % IV SOLN
150.0000 mg | Freq: Once | INTRAVENOUS | Status: AC
  Administered 2020-01-22: 150 mg via INTRAVENOUS
  Filled 2020-01-22: qty 1.2

## 2020-01-22 MED ORDER — ENOXAPARIN SODIUM 40 MG/0.4ML ~~LOC~~ SOLN: 40.0000 mg | SUBCUTANEOUS | Status: DC

## 2020-01-22 MED ORDER — ENOXAPARIN SODIUM 60 MG/0.6ML ~~LOC~~ SOLN
60.0000 mg | SUBCUTANEOUS | Status: DC
  Administered 2020-01-22 – 2020-01-27 (×6): 60 mg via SUBCUTANEOUS
  Filled 2020-01-22 (×8): qty 0.6

## 2020-01-22 NOTE — ED Provider Notes (Signed)
[facility] EMERGENCY DEPARTMENT Provider Note   CSN: [id number] Arrival date & time: 01/22/20  1458     History Chief Complaint  Patient presents with  . Covid Positive  . Shortness of Breath    Mckenzie Miller is a 34 y.o. female presenting for for evaluation of cough and shortness of breath.  Pt states she developed sxs of covid 9 days ago. Her shortness of breath worsened today.  She tested positive for Covid on Monday.  She states she is extremely short of breath especially with exertion.  She denies fevers, chills, chest pain.  She denies history of asthma or COPD.  She did not receive the Covid vaccine.  She has no other medical problems, takes no medications daily.  Patient reports she was started on prednisone and a Z-Pak when her Covid resulted positive, however she has not had any improvement with these medications.  Additional history obtained from triage note.  Patient arrived with room air sats of 86% on room air.  HPI       History reviewed. No pertinent past medical history.  Patient Active Problem List   Diagnosis Date Noted  . Acute respiratory disease due to COVID-19 virus 01/22/2020    Past Surgical History:  Procedure Laterality Date  . KNEE SURGERY       OB History   No obstetric history on file.     No family history on file.  Social History   Tobacco Use  . Smoking status: Current Every Day Smoker  . Smokeless tobacco: Never Used  Substance Use Topics  . Alcohol use: Yes  . Drug use: No    Home Medications Prior to Admission medications   Medication Sig Start Date End Date Taking? Authorizing Provider  azithromycin (ZITHROMAX Z-PAK) 250 MG tablet Take 1 tablet (250 mg total) by mouth daily. 500mg  PO day 1, then 250mg  PO days 205 08/25/14   Szekalski, Kaitlyn, PA-C  clindamycin (CLEOCIN) 150 MG capsule Take 3 capsules (450 mg total) by mouth 3 (three) times daily. 12/25/16   Muthersbaugh, 08/27/14, PA-C    HYDROcodone-homatropine (HYCODAN) 5-1.5 MG/5ML syrup Take 5 mLs by mouth every 6 (six) hours as needed. 08/25/14   Dahlia Client, PA-C  ibuprofen (ADVIL,MOTRIN) 200 MG tablet Take 400 mg by mouth every 6 (six) hours as needed for moderate pain.    [provider]  oxyCODONE (ROXICODONE) 5 MG immediate release tablet Take 1 tablet (5 mg total) by mouth every 6 (six) hours as needed for severe pain. 12/25/16   Muthersbaugh, Emilia Beck, PA-C  Phenylephrine-DM-GG-APAP 5-10-200-325 MG TABS Take 2 tablets by mouth 2 (two) times daily as needed (for cold).    [provider]    Allergies    Sulfa antibiotics and Sulfites  Review of Systems   Review of Systems  Respiratory: Positive for cough and shortness of breath.   All other systems reviewed and are negative.   Physical Exam Updated Vital Signs BP 112/80 (BP Location: Left Arm)   Pulse 99   Temp (!) 102 F (38.9 C) (Oral)   Resp 18   LMP 01/17/2020   SpO2 98%   Physical Exam Vitals and nursing note reviewed.  Constitutional:      General: She is not in acute distress.    Appearance: She is well-developed. She is obese.  HENT:     Head: Normocephalic and atraumatic.  Eyes:     Conjunctiva/sclera: Conjunctivae normal.     Pupils: Pupils are  equal, round, and reactive to light.  Cardiovascular:     Rate and Rhythm: Normal rate and regular rhythm.     Pulses: Normal pulses.  Pulmonary:     Effort: Pulmonary effort is normal. No respiratory distress.     Breath sounds: Normal breath sounds. No wheezing.     Comments: Speaking in full sentences. On O2 via [place].  Abdominal:     General: There is no distension.     Palpations: Abdomen is soft. There is no mass.     Tenderness: There is no abdominal tenderness. There is no guarding or rebound.  Musculoskeletal:        General: Normal range of motion.     Cervical back: Normal range of motion and neck supple.  Skin:    General: Skin is warm and dry.     Capillary  Refill: Capillary refill takes less than 2 seconds.  Neurological:     Mental Status: She is alert and oriented to person, place, and time.     ED Results / Procedures / Treatments   Labs (all labs ordered are listed, but only abnormal results are displayed) Labs Reviewed  BASIC METABOLIC PANEL - Abnormal; Notable for the following components:      Result Value   Potassium 3.3 (*)    Chloride 97 (*)    Glucose, Bld 189 (*)    BUN <5 (*)    Calcium 8.7 (*)    Anion gap 18 (*)    All other components within normal limits  SARS CORONAVIRUS 2 BY RT PCR (HOSPITAL ORDER, PERFORMED IN [facility])  CULTURE, BLOOD (ROUTINE X 2)  CULTURE, BLOOD (ROUTINE X 2)  CBC  URINALYSIS, ROUTINE W REFLEX MICROSCOPIC  LACTIC ACID, PLASMA  LACTIC ACID, PLASMA  D-DIMER, QUANTITATIVE (NOT AT [facility])  PROCALCITONIN  LACTATE DEHYDROGENASE  FERRITIN  TRIGLYCERIDES  FIBRINOGEN  C-REACTIVE PROTEIN  HEPATIC FUNCTION PANEL  I-STAT BETA HCG BLOOD, ED ([facility], [facility], [facility] ONLY)  I-STAT BETA HCG BLOOD, ED ([facility], [facility], [facility] ONLY)    EKG None  Radiology DG Chest Portable 1 View  Result Date: 01/22/2020 CLINICAL DATA:  Cough and shortness of breath. COVID positive yesterday. EXAM: PORTABLE CHEST 1 VIEW COMPARISON:  Chest radiograph 08/25/2014 FINDINGS: Patchy heterogeneous bilateral airspace opacities in a mid-lower lung zone predominant distribution, moderate parenchymal involvement. The heart is normal in size. Normal mediastinal contours. No pneumothorax or evidence of pneumomediastinum. No significant pleural effusion. No acute osseous abnormalities are seen. IMPRESSION: Bilateral patchy heterogeneous airspace opacities consistent with multifocal pneumonia, pattern typical of COVID-19. Electronically Signed   By: Narda Rutherford M.D.   On: 01/22/2020 16:21    Procedures Procedures (including critical care time)  Medications Ordered in ED Medications  sodium chloride flush (NS) 0.9 % injection 3 mL (3  mLs Intravenous Not Given 01/22/20 2017)  methylPREDNISolone sodium succinate (SOLU-MEDROL) 150 mg in sodium chloride 0.9 % 50 mL IVPB (has no administration in time range)  albuterol (VENTOLIN HFA) 108 (90 Base) MCG/ACT inhaler 2 puff (2 puffs Inhalation Given 01/22/20 2016)  acetaminophen (TYLENOL) tablet 1,000 mg (1,000 mg Oral Given 01/22/20 2016)    ED Course  I have reviewed the triage vital signs and the nursing notes.  Pertinent labs & imaging results that were available during my care of the patient were reviewed by me and considered in my medical decision making (see chart for details).    MDM Rules/Calculators/A&P  Patient presented for evaluation of cough and shortness of breath in the setting of Covid infection.  On exam, patient appears nontoxic.  She is on oxygen via nasal cannula, she was hypoxic on room air.  I took patient off oxygen, however she once again desatted and she was placed back on O2.  Chest x-ray obtained from triage read interpreted by me, shows multifocal pneumonia consistent with Covid.  Labs interpreted by me, overall reassuring.  Will order inflammatory markers and admit to hospitalist due to hypoxia.  Discussed with Dr. Toniann Fail from [facility] hospitalist service, patient to be admitted.  Requesting 1 mg/kg Solu-Medrol dosing.  Final Clinical Impression(s) / ED Diagnoses Final diagnoses:  COVID-19  Hypoxia  Pneumonia due to COVID-19 virus    Rx / DC Orders ED Discharge Orders    None       Alveria Apley, PA-C 01/22/20 2033    Pollyann Savoy, MD 01/22/20 2123

## 2020-01-22 NOTE — H&P (Signed)
History and Physical    Mckenzie Miller IEP:[id number] DOB: Jun 25, 1985 DOA: 01/22/2020  PCP: Patient, No Pcp Per  Patient coming from: Home.  Chief Complaint: Shortness of breath.  HPI: Mckenzie Miller is a 34 y.o. female with history of morbid obesity presents to the ER with complaint of shortness of breath. Patient states she was diagnosed with COVID-19 infection on January 16, 2020 at [facility]. At that time patient's initial symptoms were headache and upper respiratory tract symptoms. Patient was just prescribed Z-Pak and prednisone despite taking which patient has become progressively short of breath with persistent cough. Patient gets short of breath even at rest. Increased on exertion. Results of her Covid test patient to the ER.    ED Course: In the ER patient was found to be short of breath and hypoxic requiring 4 L oxygen and when she exerts he goes up to 6 L. Chest x-ray shows multifocal pneumonia CRP was 9.8 AST 138 ALT 240 D-dimer 1.02 EKG sinus tachycardia. Patient admitted for further management of acute respiratory failure with hypoxia secondary to COVID-19 infection.  Review of Systems: As per HPI, rest all negative.   History reviewed. No pertinent past medical history.  Past Surgical History:  Procedure Laterality Date  . KNEE SURGERY       reports that she has been smoking. She has never used smokeless tobacco. She reports current alcohol use. She reports that she does not use drugs.  Allergies  Allergen Reactions  . Sulfa Antibiotics Hives  . Sulfites Hives    Unsure which one, allergic     Family History  Problem Relation Age of Onset  . Diabetes Mellitus II Mother     Prior to Admission medications   Medication Sig Start Date End Date Taking? Authorizing Provider  acetaminophen (TYLENOL) 325 MG tablet Take 650 mg by mouth every 6 (six) hours as needed for mild pain or headache.   Yes [provider]  Ascorbic Acid (VITAMIN  C) 1000 MG tablet Take 1,000 mg by mouth daily.   Yes [provider]  azithromycin (ZITHROMAX Z-PAK) 250 MG tablet Take 1 tablet (250 mg total) by mouth daily. 500mg  PO day 1, then 250mg  PO days 205 Patient taking differently: Take 250 mg by mouth See admin instructions. Take 2 tablet (500mg  totally) by mouth in day 1, then take 1 tablet (250mg  totally) by mouth from day 2 to 5 08/25/14  Yes Szekalski, Walker, PA-C  Dextromethorphan Polistirex (ROBITUSSIN 12 HOUR COUGH PO) Take 20 mLs by mouth 2 (two) times daily as needed (for cough).   Yes [provider]  predniSONE (DELTASONE) 20 MG tablet Take 20 mg by mouth See admin instructions. Take 2 tablets (40 mg totally) by mouth in day 1; then take 1 tablet (20 mg totally) by mouth for 5 days 01/18/20  Yes [provider]  zinc gluconate 50 MG tablet Take 50 mg by mouth daily.   Yes [provider]  HYDROcodone-homatropine (HYCODAN) 5-1.5 MG/5ML syrup Take 5 mLs by mouth every 6 (six) hours as needed. Patient not taking: Reported on 01/22/2020 08/25/14   [place], PA-C  oxyCODONE (ROXICODONE) 5 MG immediate release tablet Take 1 tablet (5 mg total) by mouth every 6 (six) hours as needed for severe pain. Patient not taking: Reported on 01/22/2020 12/25/16   Muthersbaugh, 08/27/14, PA-C    Physical Exam: Constitutional: Moderately built and nourished. Vitals:   01/22/20 1507 01/22/20 1633 01/22/20 1701 01/22/20 1738  BP: 125/87 118/87 (!) 131/98 112/80  Pulse: (!) 126 (!) 116 (!) 113 99  Resp: (!) 22 (!) 22 (!) 24 18  Temp: 100 F (37.8 C)  (!) 102 F (38.9 C)   TempSrc: Oral  Oral   SpO2: 93% 92% 91% 98%   Eyes: Anicteric no pallor. ENMT: No discharge from the ears eyes nose or mouth. Neck: No mass felt. No neck rigidity. Respiratory: No rhonchi or crepitations. Cardiovascular: S1-S2 heard. Abdomen: Soft nontender bowel sounds present. Musculoskeletal: No edema. Skin: No rash. Neurologic: Alert  awake oriented to time place and person. Moves all extremities. Psychiatric: Appears normal. Normal affect.   Labs on Admission: I have personally reviewed following labs and imaging studies  CBC: Recent Labs  Lab 01/22/20 1549  WBC 6.1  HGB 13.3  HCT 41.1  MCV 89.3  PLT 285   Basic Metabolic Panel: Recent Labs  Lab 01/22/20 1549  NA 139  K 3.3*  CL 97*  CO2 24  GLUCOSE 189*  BUN <5*  CREATININE 0.96  CALCIUM 8.7*   GFR: CrCl cannot be calculated (Unknown ideal weight.). Liver Function Tests: Recent Labs  Lab 01/22/20 2035  AST 138*  ALT 240*  ALKPHOS 75  BILITOT 1.5*  PROT 7.0  ALBUMIN 3.0*   No results for input(s): LIPASE, AMYLASE in the last 168 hours. No results for input(s): AMMONIA in the last 168 hours. Coagulation Profile: No results for input(s): INR, PROTIME in the last 168 hours. Cardiac Enzymes: No results for input(s): CKTOTAL, CKMB, CKMBINDEX, TROPONINI in the last 168 hours. BNP (last 3 results) No results for input(s): PROBNP in the last 8760 hours. HbA1C: No results for input(s): HGBA1C in the last 72 hours. CBG: No results for input(s): GLUCAP in the last 168 hours. Lipid Profile: Recent Labs    01/22/20 2035  TRIG 156*   Thyroid Function Tests: No results for input(s): TSH, T4TOTAL, FREET4, T3FREE, THYROIDAB in the last 72 hours. Anemia Panel: No results for input(s): VITAMINB12, FOLATE, FERRITIN, TIBC, IRON, RETICCTPCT in the last 72 hours. Urine analysis:    Component Value Date/Time   COLORURINE AMBER BIOCHEMICALS MAY BE AFFECTED BY COLOR (A) 08/18/2009 0759   APPEARANCEUR CLOUDY (A) 08/18/2009 0759   LABSPEC 1.030 08/18/2009 0759   PHURINE 6.0 08/18/2009 0759   GLUCOSEU NEGATIVE 08/18/2009 0759   HGBUR NEGATIVE 08/18/2009 0759   BILIRUBINUR NEGATIVE 08/18/2009 0759   KETONESUR 15 (A) 08/18/2009 0759   PROTEINUR NEGATIVE 08/18/2009 0759   UROBILINOGEN 0.2 08/18/2009 0759   NITRITE NEGATIVE 08/18/2009 0759    LEUKOCYTESUR SMALL (A) 08/18/2009 0759   Sepsis Labs: @LABRCNTIP (procalcitonin:4,lacticidven:4) )No results found for this or any previous visit (from the past 240 hour(s)).   Radiological Exams on Admission: DG Chest Portable 1 View  Result Date: 01/22/2020 CLINICAL DATA:  Cough and shortness of breath. COVID positive yesterday. EXAM: PORTABLE CHEST 1 VIEW COMPARISON:  Chest radiograph 08/25/2014 FINDINGS: Patchy heterogeneous bilateral airspace opacities in a mid-lower lung zone predominant distribution, moderate parenchymal involvement. The heart is normal in size. Normal mediastinal contours. No pneumothorax or evidence of pneumomediastinum. No significant pleural effusion. No acute osseous abnormalities are seen. IMPRESSION: Bilateral patchy heterogeneous airspace opacities consistent with multifocal pneumonia, pattern typical of COVID-19. Electronically Signed   By: 08/27/2014 M.D.   On: 01/22/2020 16:21    EKG: Independently reviewed. Sinus tachycardia.  Assessment/Plan Principal Problem:   Acute respiratory disease due to COVID-19 virus    1. Acute respiratory failure with hypoxia presently  on 4 L oxygen secondary to COVID-19 infection for which I have started patient on Solu-Medrol and remdesivir. I also discussed with patient about off label use with side effects and contraindications of of baricitinib for which patient agrees to get it if required. Presently on 4 L we'll closely monitor respiratory status and failure markers. 2. Elevated LFTs abdomen appears benign likely from Covid infection. Follow LFTs. Will check acute hepatitis panel. 3. Mild hypokalemia replace and recheck. 4. Morbid obesity will need counseling. 5. Hyperglycemia likely from recent prednisone use. Follow CBGs closely since patient is also on IV steroids at this time. Check hemoglobin A1c.  Since patient is acute respiratory failure requiring 4 L oxygen with Covid infection will need close monitoring  for any further worsening in inpatient status.   DVT prophylaxis: Lovenox. Code Status: Full code. Family Communication: Discussed with patient. Disposition Plan: Home. Consults called: None. Admission status: Inpatient.   Eduard Clos MD [facility] Pager [phone].  If 7PM-7AM, please contact night-coverage [email or web address] Password [facility]  01/22/2020, 9:38 PM

## 2020-01-22 NOTE — ED Triage Notes (Signed)
Pt reports COVID + on Monday.  C/o SOB and cough. Sats low on arrival.

## 2020-01-22 NOTE — ED Notes (Signed)
Pt SpO2 86% on room air. Pt placed on 4L via [place] SpO2 93%.

## 2020-01-23 ENCOUNTER — Encounter (HOSPITAL_COMMUNITY): Payer: Self-pay | Admitting: Internal Medicine

## 2020-01-23 LAB — URINALYSIS, ROUTINE W REFLEX MICROSCOPIC
Bilirubin Urine: NEGATIVE
Glucose, UA: 150 mg/dL — AB
Hgb urine dipstick: NEGATIVE
Ketones, ur: NEGATIVE mg/dL
Leukocytes,Ua: NEGATIVE
Nitrite: NEGATIVE
Protein, ur: 30 mg/dL — AB
Specific Gravity, Urine: 1.032 — ABNORMAL HIGH (ref 1.005–1.030)
pH: 5 (ref 5.0–8.0)

## 2020-01-23 LAB — COMPREHENSIVE METABOLIC PANEL
ALT: 206 U/L — ABNORMAL HIGH (ref 0–44)
AST: 96 U/L — ABNORMAL HIGH (ref 15–41)
Albumin: 2.8 g/dL — ABNORMAL LOW (ref 3.5–5.0)
Alkaline Phosphatase: 74 U/L (ref 38–126)
Anion gap: 12 (ref 5–15)
BUN: 5 mg/dL — ABNORMAL LOW (ref 6–20)
CO2: 25 mmol/L (ref 22–32)
Calcium: 8.5 mg/dL — ABNORMAL LOW (ref 8.9–10.3)
Chloride: 100 mmol/L (ref 98–111)
Creatinine, Ser: 0.87 mg/dL (ref 0.44–1.00)
GFR calc Af Amer: >60 mL/min (ref >60)
GFR calc non Af Amer: >60 mL/min (ref >60)
Glucose, Bld: 281 mg/dL — ABNORMAL HIGH (ref 70–99)
Potassium: 3.5 mmol/L (ref 3.5–5.1)
Sodium: 137 mmol/L (ref 135–145)
Total Bilirubin: 1.4 mg/dL — ABNORMAL HIGH (ref 0.3–1.2)
Total Protein: 7.2 g/dL (ref 6.5–8.1)

## 2020-01-23 LAB — GLUCOSE, CAPILLARY: Glucose-Capillary: 364 mg/dL — ABNORMAL HIGH (ref 70–99)

## 2020-01-23 LAB — CBC WITH DIFFERENTIAL/PLATELET
Abs Immature Granulocytes: 0 10*3/uL (ref 0.00–0.07)
Basophils Absolute: 0 10*3/uL (ref 0.0–0.1)
Basophils Relative: 0 %
Eosinophils Absolute: 0 10*3/uL (ref 0.0–0.5)
Eosinophils Relative: 0 %
HCT: 46.1 % — ABNORMAL HIGH (ref 36.0–46.0)
Hemoglobin: 15.1 g/dL — ABNORMAL HIGH (ref 12.0–15.0)
Lymphocytes Relative: 16 %
Lymphs Abs: 0.5 10*3/uL — ABNORMAL LOW (ref 0.7–4.0)
MCH: 29.8 pg (ref 26.0–34.0)
MCHC: 32.8 g/dL (ref 30.0–36.0)
MCV: 90.9 fL (ref 80.0–100.0)
Monocytes Absolute: 0.1 10*3/uL (ref 0.1–1.0)
Monocytes Relative: 2 %
Neutro Abs: 2.4 10*3/uL (ref 1.7–7.7)
Neutrophils Relative %: 82 %
Platelets: 232 10*3/uL (ref 150–400)
RBC: 5.07 MIL/uL (ref 3.87–5.11)
RDW: 13.3 % (ref 11.5–15.5)
WBC: 2.9 10*3/uL — ABNORMAL LOW (ref 4.0–10.5)
nRBC: 0 % (ref 0.0–0.2)
nRBC: 1 /100 WBC — ABNORMAL HIGH

## 2020-01-23 LAB — D-DIMER, QUANTITATIVE: D-Dimer, Quant: 1.13 ug/mL-FEU — ABNORMAL HIGH (ref 0.00–0.50)

## 2020-01-23 LAB — RESPIRATORY PANEL BY RT PCR (FLU A&B, COVID)
Influenza A by PCR: NEGATIVE
Influenza B by PCR: NEGATIVE
SARS Coronavirus 2 by RT PCR: POSITIVE — AB

## 2020-01-23 LAB — PROCALCITONIN: Procalcitonin: 0.1 ng/mL

## 2020-01-23 LAB — HIV ANTIBODY (ROUTINE TESTING W REFLEX): HIV Screen 4th Generation wRfx: NONREACTIVE

## 2020-01-23 LAB — HEPATITIS PANEL, ACUTE
HCV Ab: NONREACTIVE
Hep A IgM: NONREACTIVE
Hep B C IgM: NONREACTIVE
Hepatitis B Surface Ag: NONREACTIVE

## 2020-01-23 LAB — CBG MONITORING, ED
Glucose-Capillary: 278 mg/dL — ABNORMAL HIGH (ref 70–99)
Glucose-Capillary: 278 mg/dL — ABNORMAL HIGH (ref 70–99)
Glucose-Capillary: 235 mg/dL — ABNORMAL HIGH (ref 70–99)

## 2020-01-23 LAB — HEMOGLOBIN A1C
Hgb A1c MFr Bld: 6.4 % — ABNORMAL HIGH (ref 4.8–5.6)
Mean Plasma Glucose: 136.98 mg/dL

## 2020-01-23 LAB — TROPONIN I (HIGH SENSITIVITY)
Troponin I (High Sensitivity): 13 ng/L (ref <18)
Troponin I (High Sensitivity): 17 ng/L (ref <18)

## 2020-01-23 LAB — ACETAMINOPHEN LEVEL: Acetaminophen (Tylenol), Serum: <10 ug/mL — ABNORMAL LOW (ref 10–30)

## 2020-01-23 LAB — C-REACTIVE PROTEIN: CRP: 12.8 mg/dL — ABNORMAL HIGH (ref <1.0)

## 2020-01-23 MED ORDER — GUAIFENESIN-DM 100-10 MG/5ML PO SYRP
10.0000 mL | ORAL_SOLUTION | ORAL | Status: DC | PRN
  Administered 2020-01-23 – 2020-01-28 (×11): 10 mL via ORAL
  Filled 2020-01-23 (×11): qty 10

## 2020-01-23 MED ORDER — METHYLPREDNISOLONE SODIUM SUCC 125 MG IJ SOLR
75.0000 mg | Freq: Two times a day (BID) | INTRAMUSCULAR | Status: AC
  Administered 2020-01-23 – 2020-01-25 (×6): 75 mg via INTRAVENOUS
  Filled 2020-01-23 (×6): qty 2

## 2020-01-23 MED ORDER — ASCORBIC ACID 500 MG PO TABS
500.0000 mg | ORAL_TABLET | Freq: Every day | ORAL | Status: DC
  Administered 2020-01-23 – 2020-01-28 (×6): 500 mg via ORAL
  Filled 2020-01-23 (×6): qty 1

## 2020-01-23 MED ORDER — INSULIN ASPART 100 UNIT/ML ~~LOC~~ SOLN
0.0000 [IU] | Freq: Three times a day (TID) | SUBCUTANEOUS | Status: DC
  Administered 2020-01-23: 5 [IU] via SUBCUTANEOUS
  Administered 2020-01-23: 3 [IU] via SUBCUTANEOUS
  Administered 2020-01-24: 5 [IU] via SUBCUTANEOUS
  Administered 2020-01-24: 7 [IU] via SUBCUTANEOUS
  Administered 2020-01-24 – 2020-01-25 (×2): 5 [IU] via SUBCUTANEOUS
  Administered 2020-01-25: 3 [IU] via SUBCUTANEOUS

## 2020-01-23 MED ORDER — POTASSIUM CHLORIDE CRYS ER 20 MEQ PO TBCR
20.0000 meq | EXTENDED_RELEASE_TABLET | Freq: Once | ORAL | Status: AC
  Administered 2020-01-23: 20 meq via ORAL
  Filled 2020-01-23: qty 1

## 2020-01-23 MED ORDER — PREDNISONE 20 MG PO TABS
50.0000 mg | ORAL_TABLET | Freq: Every day | ORAL | Status: DC
  Administered 2020-01-26: 50 mg via ORAL
  Filled 2020-01-23: qty 2

## 2020-01-23 MED ORDER — INSULIN ASPART 100 UNIT/ML ~~LOC~~ SOLN
5.0000 [IU] | Freq: Once | SUBCUTANEOUS | Status: AC
  Administered 2020-01-23: 5 [IU] via SUBCUTANEOUS

## 2020-01-23 MED ORDER — PANTOPRAZOLE SODIUM 40 MG PO TBEC
40.0000 mg | DELAYED_RELEASE_TABLET | Freq: Every day | ORAL | Status: DC
  Administered 2020-01-23 – 2020-01-28 (×6): 40 mg via ORAL
  Filled 2020-01-23 (×6): qty 1

## 2020-01-23 MED ORDER — SODIUM CHLORIDE 0.9 % IV SOLN
200.0000 mg | Freq: Once | INTRAVENOUS | Status: AC
  Administered 2020-01-23: 200 mg via INTRAVENOUS
  Filled 2020-01-23: qty 40

## 2020-01-23 MED ORDER — SODIUM CHLORIDE 0.9 % IV SOLN
100.0000 mg | Freq: Every day | INTRAVENOUS | Status: AC
  Administered 2020-01-24 – 2020-01-27 (×4): 100 mg via INTRAVENOUS
  Filled 2020-01-23 (×4): qty 20

## 2020-01-23 MED ORDER — ZINC SULFATE 220 (50 ZN) MG PO CAPS
220.0000 mg | ORAL_CAPSULE | Freq: Every day | ORAL | Status: DC
  Administered 2020-01-23 – 2020-01-28 (×6): 220 mg via ORAL
  Filled 2020-01-23 (×6): qty 1

## 2020-01-23 NOTE — ED Notes (Signed)
Assuming care of patient at this time. Pt resting comfortably and in NAD. Resp even and unlabored.

## 2020-01-23 NOTE — Progress Notes (Signed)
PROGRESS NOTE                                                                                                                                                                                                             Patient Demographics:    Mckenzie Miller, is a 34 y.o. female, DOB - 02/17/1986, ZOX:[id number]  Admit date - 01/22/2020   Admitting Physician Eduard ClosArshad N Kakrakandy, MD  Outpatient Primary MD for the patient is Patient, No Pcp Per  LOS - 1   Chief Complaint  Patient presents with  . Covid Positive  . Shortness of Breath       Brief Narrative    Mckenzie Miller is a 34 y.o. female with history of morbid obesity presents to the ER with complaint of shortness of breath. Patient states she was diagnosed with COVID-19 infection on January 16, 2020 at [facility]. At that time patient's initial symptoms were headache and upper respiratory tract symptoms. Patient was just prescribed Z-Pak and prednisone despite taking which patient has become progressively short of breath with persistent cough. Patient gets short of breath even at rest. Increased on exertion. Results of her Covid test patient to the ER.   Subjective:    [place] Soh today does report dyspnea, cough, nausea, denies any chest pain. .    Assessment  & Plan :    Principal Problem:   Acute respiratory disease due to COVID-19 virus  Acute hypoxic respiratory failure due to COVID-19 pneumonia  -Patient currently requiring 3 to 4 L liters nasal cannula oxygen . -Chest x-ray significant for bilateral multifocal opacities due to COVID-19 pneumonia. -Continue with IV steroids. -Continue with IV remdesivir. -She is on 3 L nasal cannula, so far no indication for baricitinib, but she has consented to use once indicated. -I have discussed at length with the patient, encouraged to use incentive spirometry, flutter valve, and she was encouraged to get out of bed to  chair and to prone once in bed. -Continue to trend inflammatory markers.   SpO2: 95 % O2 Flow Rate (L/min): 4 L/min  COVID-19 Labs  Recent Labs    01/22/20 2035 01/23/20 0550  DDIMER 1.02* 1.13*  FERRITIN 524*  --   LDH 731*  --   CRP 9.8* 12.8*    Lab Results  Component Value Date  SARSCOV2NAA POSITIVE (A) 01/23/2020   SARSCOV2NAA NEGATIVE 01/22/2020    Elevated LFTs  -This is secondary to COVID-19 of pneumonia, trend liver enzymes . -Negative hepatitis panel.  Mild hypokalemia  -Repleted, recheck in a.m. replace and recheck.  Hyperglycemia -A1c is 6.4, prediabetes, continue with insulin sliding scale, likely will need long-acting insulin during hospital stay given she is on steroids.    COVID-19 Labs  Recent Labs    01/22/20 2035 01/23/20 0550  DDIMER 1.02* 1.13*  FERRITIN 524*  --   LDH 731*  --   CRP 9.8* 12.8*    Lab Results  Component Value Date   SARSCOV2NAA POSITIVE (A) 01/23/2020   SARSCOV2NAA NEGATIVE 01/22/2020     Code Status : Full code  Family Communication  : Discussed at length with the patient, all her questions were answered  Disposition Plan  :  Status is: Inpatient  Remains inpatient appropriate because:Hemodynamically unstable and IV treatments appropriate due to intensity of illness or inability to take PO   Dispo: The patient is from: Home              Anticipated d/c is to: Home              Anticipated d/c date is: 3 days              Patient currently is not medically stable to d/c.    Consults  : None  Procedures  : None  DVT Prophylaxis  : Subcu Lovenox  Lab Results  Component Value Date   PLT 232 01/23/2020    Antibiotics  :    Anti-infectives (From admission, onward)   Start     Dose/Rate Route Frequency Ordered Stop   01/24/20 1000  remdesivir 100 mg in sodium chloride 0.9 % 100 mL IVPB       "Followed by" Linked Group Details   100 mg 200 mL/hr over 30 Minutes Intravenous Daily 01/23/20 0524  01/28/20 0959   01/23/20 0630  remdesivir 200 mg in sodium chloride 0.9% 250 mL IVPB       "Followed by" Linked Group Details   200 mg 580 mL/hr over 30 Minutes Intravenous Once 01/23/20 0524 01/23/20 0824        Objective:   Vitals:   01/23/20 0700 01/23/20 0757 01/23/20 1000 01/23/20 1330  BP: 126/64   128/89  Pulse: (!) 58   83  Resp: 18   18  Temp:  98.2 F (36.8 C)  98.7 F (37.1 C)  TempSrc:  Oral  Oral  SpO2: 97%   95%  Weight:   135.2 kg   Height:        Wt Readings from Last 3 Encounters:  01/23/20 135.2 kg  12/24/16 (!) 152.2 kg  08/25/14 (!) 149.2 kg     Intake/Output Summary (Last 24 hours) at 01/23/2020 1612 Last data filed at 01/23/2020 0824 Gross per 24 hour  Intake 300 ml  Output --  Net 300 ml     Physical Exam  Awake Alert, Oriented X 3, No new F.N deficits, Normal affect Symmetrical Chest wall movement, Good air movement bilaterally, CTAB RRR,No Gallops,Rubs or new Murmurs, No Parasternal Heave +ve B.Sounds, Abd Soft, No tenderness, No rebound - guarding or rigidity. No Cyanosis, Clubbing or edema, No new Rash or bruise     Data Review:    CBC Recent Labs  Lab 01/22/20 1549 01/23/20 0550  WBC 6.1 2.9*  HGB 13.3 15.1*  HCT 41.1 46.1*  PLT 285 232  MCV 89.3 90.9  MCH 28.9 29.8  MCHC 32.4 32.8  RDW 13.2 13.3  LYMPHSABS  --  0.5*  MONOABS  --  0.1  EOSABS  --  0.0  BASOSABS  --  0.0    Chemistries  Recent Labs  Lab 01/22/20 1549 01/22/20 2035 01/23/20 0550  NA 139  --  137  K 3.3*  --  3.5  CL 97*  --  100  CO2 24  --  25  GLUCOSE 189*  --  281*  BUN <5*  --  5*  CREATININE 0.96  --  0.87  CALCIUM 8.7*  --  8.5*  AST  --  138* 96*  ALT  --  240* 206*  ALKPHOS  --  75 74  BILITOT  --  1.5* 1.4*   ------------------------------------------------------------------------------------------------------------------ Recent Labs    01/22/20 2035  TRIG 156*    Lab Results  Component Value Date   HGBA1C 6.4 (H)  01/23/2020   ------------------------------------------------------------------------------------------------------------------ No results for input(s): TSH, T4TOTAL, T3FREE, THYROIDAB in the last 72 hours.  Invalid input(s): FREET3 ------------------------------------------------------------------------------------------------------------------ Recent Labs    01/22/20 2035  FERRITIN 524*    Coagulation profile No results for input(s): INR, PROTIME in the last 168 hours.  Recent Labs    01/22/20 2035 01/23/20 0550  DDIMER 1.02* 1.13*    Cardiac Enzymes No results for input(s): CKMB, TROPONINI, MYOGLOBIN in the last 168 hours.  Invalid input(s): CK ------------------------------------------------------------------------------------------------------------------ No results found for: BNP  Inpatient Medications  Scheduled Meds: . vitamin C  500 mg Oral Daily  . enoxaparin (LOVENOX) injection  60 mg Subcutaneous Q24H  . insulin aspart  0-9 Units Subcutaneous TID WC  . methylPREDNISolone (SOLU-MEDROL) injection  75 mg Intravenous Q12H   Followed by  . [START ON 01/26/2020] predniSONE  50 mg Oral Daily  . sodium chloride flush  3 mL Intravenous Once  . zinc sulfate  220 mg Oral Daily   Continuous Infusions: . [START ON 01/24/2020] remdesivir 100 mg in NS 100 mL     PRN Meds:.acetaminophen **OR** acetaminophen, guaiFENesin-dextromethorphan, ondansetron **OR** ondansetron (ZOFRAN) IV  Micro Results Recent Results (from the past 240 hour(s))  SARS Coronavirus 2 by RT PCR (hospital order, performed in [facility] hospital lab) Nasopharyngeal Nasopharyngeal Swab     Status: None   Collection Time: 01/22/20  8:05 PM   Specimen: Nasopharyngeal Swab  Result Value Ref Range Status   SARS Coronavirus 2 NEGATIVE NEGATIVE Final    Comment: (NOTE) SARS-CoV-2 target nucleic acids are NOT DETECTED.  The SARS-CoV-2 RNA is generally detectable in upper and lower respiratory specimens  during the acute phase of infection. The lowest concentration of SARS-CoV-2 viral copies this assay can detect is 250 copies / mL. A negative result does not preclude SARS-CoV-2 infection and should not be used as the sole basis for treatment or other patient management decisions.  A negative result may occur with improper specimen collection / handling, submission of specimen other than nasopharyngeal swab, presence of viral mutation(s) within the areas targeted by this assay, and inadequate number of viral copies (<250 copies / mL). A negative result must be combined with clinical observations, patient history, and epidemiological information.  Fact Sheet for Patients:   BoilerBrush.com.cy  Fact Sheet for Healthcare Providers: https://pope.com/  This test is not yet approved or  cleared by the [place] FDA and has been authorized for detection and/or diagnosis of SARS-CoV-2 by FDA under an Emergency Use Authorization (  EUA).  This EUA will remain in effect (meaning this test can be used) for the duration of the COVID-19 declaration under Section 564(b)(1) of the Act, 21 U.S.C. section 360bbb-3(b)(1), unless the authorization is terminated or revoked sooner.  Performed at [facility], [address] [place], [place] [address]   Blood Culture (routine x 2)     Status: None (Preliminary result)   Collection Time: 01/22/20  8:53 PM   Specimen: BLOOD  Result Value Ref Range Status   Specimen Description BLOOD RIGHT ANTECUBITAL  Final   Special Requests   Final    BOTTLES DRAWN AEROBIC AND ANAEROBIC Blood Culture adequate volume   Culture   Final    NO GROWTH < 24 HOURS Performed at [facility], [address], [place], [place] [address]    Report Status PENDING  Incomplete  Respiratory Panel by RT PCR (Flu A&B, Covid) - Nasopharyngeal Swab     Status: Abnormal   Collection Time: 01/23/20  8:40 AM   Specimen:  Nasopharyngeal Swab  Result Value Ref Range Status   SARS Coronavirus 2 by RT PCR POSITIVE (A) NEGATIVE Final    Comment: emailed L. Berdik RN 10:20 01/23/20 ([email or web address]) (NOTE) SARS-CoV-2 target nucleic acids are DETECTED.  SARS-CoV-2 RNA is generally detectable in upper respiratory specimens  during the acute phase of infection. Positive results are indicative of the presence of the identified virus, but do not rule out bacterial infection or co-infection with other pathogens not detected by the test. Clinical correlation with patient history and other diagnostic information is necessary to determine patient infection status. The expected result is Negative.  Fact Sheet for Patients:  https://www.moore.com/  Fact Sheet for Healthcare Providers: https://www.young.biz/  This test is not yet approved or cleared by the [place] FDA and  has been authorized for detection and/or diagnosis of SARS-CoV-2 by FDA under an Emergency Use Authorization (EUA).  This EUA will remain in effect (meaning this test can be used) for the duration of  the COVID-19  declaration under Section 564(b)(1) of the Act, 21 U.S.C. section 360bbb-3(b)(1), unless the authorization is terminated or revoked sooner.      Influenza A by PCR NEGATIVE NEGATIVE Final   Influenza B by PCR NEGATIVE NEGATIVE Final    Comment: (NOTE) The Xpert Xpress SARS-CoV-2/FLU/RSV assay is intended as an aid in  the diagnosis of influenza from Nasopharyngeal swab specimens and  should not be used as a sole basis for treatment. Nasal washings and  aspirates are unacceptable for Xpert Xpress SARS-CoV-2/FLU/RSV  testing.  Fact Sheet for Patients: https://www.moore.com/  Fact Sheet for Healthcare Providers: https://www.young.biz/  This test is not yet approved or cleared by the [place] FDA and  has been authorized for detection and/or diagnosis  of SARS-CoV-2 by  FDA under an Emergency Use Authorization (EUA). This EUA will remain  in effect (meaning this test can be used) for the duration of the  Covid-19 declaration under Section 564(b)(1) of the Act, 21  U.S.C. section 360bbb-3(b)(1), unless the authorization is  terminated or revoked. Performed at [facility], [address], [place], [place] [address]     Radiology Reports DG Chest Portable 1 View  Result Date: 01/22/2020 CLINICAL DATA:  Cough and shortness of breath. COVID positive yesterday. EXAM: PORTABLE CHEST 1 VIEW COMPARISON:  Chest radiograph 08/25/2014 FINDINGS: Patchy heterogeneous bilateral airspace opacities in a mid-lower lung zone predominant distribution, moderate parenchymal involvement. The heart is normal in size.  Normal mediastinal contours. No pneumothorax or evidence of pneumomediastinum. No significant pleural effusion. No acute osseous abnormalities are seen. IMPRESSION: Bilateral patchy heterogeneous airspace opacities consistent with multifocal pneumonia, pattern typical of COVID-19. Electronically Signed   By: Narda Rutherford M.D.   On: 01/22/2020 16:21     Huey Bienenstock M.D on 01/23/2020 at 4:12 PM    [facility] -  Office  [phone]

## 2020-01-24 LAB — COMPREHENSIVE METABOLIC PANEL
ALT: 170 U/L — ABNORMAL HIGH (ref 0–44)
AST: 75 U/L — ABNORMAL HIGH (ref 15–41)
Albumin: 2.6 g/dL — ABNORMAL LOW (ref 3.5–5.0)
Alkaline Phosphatase: 75 U/L (ref 38–126)
Anion gap: 11 (ref 5–15)
BUN: 9 mg/dL (ref 6–20)
CO2: 25 mmol/L (ref 22–32)
Calcium: 8.8 mg/dL — ABNORMAL LOW (ref 8.9–10.3)
Chloride: 99 mmol/L (ref 98–111)
Creatinine, Ser: 0.9 mg/dL (ref 0.44–1.00)
GFR calc Af Amer: >60 mL/min (ref >60)
GFR calc non Af Amer: >60 mL/min (ref >60)
Glucose, Bld: 342 mg/dL — ABNORMAL HIGH (ref 70–99)
Potassium: 3.6 mmol/L (ref 3.5–5.1)
Sodium: 135 mmol/L (ref 135–145)
Total Bilirubin: 0.7 mg/dL (ref 0.3–1.2)
Total Protein: 6.8 g/dL (ref 6.5–8.1)

## 2020-01-24 LAB — CBC WITH DIFFERENTIAL/PLATELET
Abs Immature Granulocytes: 0.08 10*3/uL — ABNORMAL HIGH (ref 0.00–0.07)
Basophils Absolute: 0 10*3/uL (ref 0.0–0.1)
Basophils Relative: 0 %
Eosinophils Absolute: 0 10*3/uL (ref 0.0–0.5)
Eosinophils Relative: 0 %
HCT: 35.5 % — ABNORMAL LOW (ref 36.0–46.0)
Hemoglobin: 11.6 g/dL — ABNORMAL LOW (ref 12.0–15.0)
Immature Granulocytes: 1 %
Lymphocytes Relative: 7 %
Lymphs Abs: 0.9 10*3/uL (ref 0.7–4.0)
MCH: 29 pg (ref 26.0–34.0)
MCHC: 32.7 g/dL (ref 30.0–36.0)
MCV: 88.8 fL (ref 80.0–100.0)
Monocytes Absolute: 0.5 10*3/uL (ref 0.1–1.0)
Monocytes Relative: 4 %
Neutro Abs: 11.8 10*3/uL — ABNORMAL HIGH (ref 1.7–7.7)
Neutrophils Relative %: 88 %
Platelets: 310 10*3/uL (ref 150–400)
RBC: 4 MIL/uL (ref 3.87–5.11)
RDW: 13.2 % (ref 11.5–15.5)
WBC: 13.3 10*3/uL — ABNORMAL HIGH (ref 4.0–10.5)
nRBC: 0 % (ref 0.0–0.2)

## 2020-01-24 LAB — GLUCOSE, CAPILLARY
Glucose-Capillary: 300 mg/dL — ABNORMAL HIGH (ref 70–99)
Glucose-Capillary: 305 mg/dL — ABNORMAL HIGH (ref 70–99)
Glucose-Capillary: 261 mg/dL — ABNORMAL HIGH (ref 70–99)

## 2020-01-24 LAB — C-REACTIVE PROTEIN: CRP: 8.7 mg/dL — ABNORMAL HIGH (ref <1.0)

## 2020-01-24 LAB — D-DIMER, QUANTITATIVE: D-Dimer, Quant: 0.39 ug/mL-FEU (ref 0.00–0.50)

## 2020-01-24 MED ORDER — ORAL CARE MOUTH RINSE
15.0000 mL | Freq: Two times a day (BID) | OROMUCOSAL | Status: DC
  Administered 2020-01-24 – 2020-01-28 (×9): 15 mL via OROMUCOSAL

## 2020-01-24 MED ORDER — INSULIN ASPART 100 UNIT/ML ~~LOC~~ SOLN
3.0000 [IU] | Freq: Three times a day (TID) | SUBCUTANEOUS | Status: DC
  Administered 2020-01-24 (×2): 3 [IU] via SUBCUTANEOUS

## 2020-01-24 MED ORDER — INSULIN ASPART 100 UNIT/ML ~~LOC~~ SOLN
6.0000 [IU] | Freq: Three times a day (TID) | SUBCUTANEOUS | Status: DC
  Administered 2020-01-24 – 2020-01-25 (×3): 6 [IU] via SUBCUTANEOUS

## 2020-01-24 MED ORDER — INSULIN GLARGINE 100 UNIT/ML ~~LOC~~ SOLN
10.0000 [IU] | Freq: Every day | SUBCUTANEOUS | Status: DC
  Administered 2020-01-24 – 2020-01-25 (×2): 10 [IU] via SUBCUTANEOUS
  Filled 2020-01-24 (×2): qty 0.1

## 2020-01-24 MED ORDER — LINAGLIPTIN 5 MG PO TABS
5.0000 mg | ORAL_TABLET | Freq: Every day | ORAL | Status: DC
  Administered 2020-01-24 – 2020-01-28 (×5): 5 mg via ORAL
  Filled 2020-01-24 (×4): qty 1

## 2020-01-24 NOTE — Progress Notes (Signed)
PROGRESS NOTE                                                                                                                                                                                                             Patient Demographics:    Mckenzie Miller, is a 34 y.o. female, DOB - 07-06-1985, NUU:[id number]  Admit date - 01/22/2020   Admitting Physician Eduard Clos, MD  Outpatient Primary MD for the patient is Patient, No Pcp Per  LOS - 2   Chief Complaint  Patient presents with  . Covid Positive  . Shortness of Breath       Brief Narrative    Mckenzie Miller is a 34 y.o. female with history of morbid obesity presents to the ER with complaint of shortness of breath. Patient states she was diagnosed with COVID-19 infection on January 16, 2020 at [facility] At that time patient's initial symptoms were headache and upper respiratory tract symptoms. Patient was just prescribed Z-Pak and prednisone despite taking which patient has become progressively short of breath with persistent cough. Patient gets short of breath even at rest. Increased on exertion. Results of her Covid test patient to the ER.   Subjective:    [name] today reports she is feeling better, still complains of cough, generalized weakness, reports her dyspnea has improved.      Assessment  & Plan :    Principal Problem:   Acute respiratory disease due to COVID-19 virus  Acute hypoxic respiratory failure due to COVID-19 pneumonia  -She is down to 2 L oxygen via nasal cannula. -Chest x-ray significant for bilateral multifocal opacities due to COVID-19 pneumonia. -Continue with IV steroids. -Continue with IV remdesivir. -She is on 2 L nasal cannula, so far no indication for baricitinib, but she has consented to use once indicated. -I have discussed at length with the patient, encouraged to use incentive spirometry, flutter valve, and she was  encouraged to get out of bed to chair and to prone once in bed. -Continue to trend inflammatory markers.   SpO2: 96 % O2 Flow Rate (L/min): 2 L/min  COVID-19 Labs  Recent Labs    01/22/20 2035 01/23/20 0550 01/24/20 0143  DDIMER 1.02* 1.13* 0.39  FERRITIN 524*  --   --   LDH 731*  --   --  CRP 9.8* 12.8* 8.7*    Lab Results  Component Value Date   SARSCOV2NAA POSITIVE (A) 01/23/2020   SARSCOV2NAA NEGATIVE 01/22/2020    Elevated LFTs  -This is secondary to COVID-19 of pneumonia, bending down -Negative hepatitis panel.  Mild hypokalemia  -Repleted,  Hyperglycemia -A1c is 6.4, prediabetes, CBG remains elevated, she is already started on Tradjenta, will start on lantus 10 units daily, and add NovoLog 6 units before meals.  Continue with insulin sliding scale.    COVID-19 Labs  Recent Labs    01/22/20 2035 01/23/20 0550 01/24/20 0143  DDIMER 1.02* 1.13* 0.39  FERRITIN 524*  --   --   LDH 731*  --   --   CRP 9.8* 12.8* 8.7*    Lab Results  Component Value Date   SARSCOV2NAA POSITIVE (A) 01/23/2020   SARSCOV2NAA NEGATIVE 01/22/2020     Code Status : Full code  Family Communication  : Discussed at length with the patient, reports she will update her family members by herself.  Disposition Plan  :  Status is: Inpatient  Remains inpatient appropriate because:Hemodynamically unstable and IV treatments appropriate due to intensity of illness or inability to take PO   Dispo: The patient is from: Home              Anticipated d/c is to: Home              Anticipated d/c date is: 3 days              Patient currently is not medically stable to d/c.    Consults  : None  Procedures  : None  DVT Prophylaxis  : Subcu Lovenox  Lab Results  Component Value Date   PLT 310 01/24/2020    Antibiotics  :    Anti-infectives (From admission, onward)   Start     Dose/Rate Route Frequency Ordered Stop   01/24/20 1000  remdesivir 100 mg in sodium chloride  0.9 % 100 mL IVPB       "Followed by" Linked Group Details   100 mg 200 mL/hr over 30 Minutes Intravenous Daily 01/23/20 0524 01/28/20 0959   01/23/20 0630  remdesivir 200 mg in sodium chloride 0.9% 250 mL IVPB       "Followed by" Linked Group Details   200 mg 580 mL/hr over 30 Minutes Intravenous Once 01/23/20 0524 01/23/20 0824        Objective:   Vitals:   01/24/20 0615 01/24/20 0735 01/24/20 0821 01/24/20 1326  BP:  125/78  125/80  Pulse: 76 81  72  Resp:  18  12  Temp:  98.3 F (36.8 C)  98.3 F (36.8 C)  TempSrc:  Oral  Axillary  SpO2: 95% 94% 95% 96%  Weight:      Height:        Wt Readings from Last 3 Encounters:  01/23/20 128.6 kg  12/24/16 (!) 152.2 kg  08/25/14 (!) 149.2 kg     Intake/Output Summary (Last 24 hours) at 01/24/2020 1440 Last data filed at 01/24/2020 0936 Gross per 24 hour  Intake 580 ml  Output --  Net 580 ml     Physical Exam  Awake Alert, Oriented X 3, No new F.N deficits, Normal affect Symmetrical Chest wall movement, Good air movement bilaterally, CTAB RRR,No Gallops,Rubs or new Murmurs, No Parasternal Heave +ve B.Sounds, Abd Soft, No tenderness, No rebound - guarding or rigidity. No Cyanosis, Clubbing or edema, No new Rash or bruise  Data Review:    CBC Recent Labs  Lab 01/22/20 1549 01/23/20 0550 01/24/20 0143  WBC 6.1 2.9* 13.3*  HGB 13.3 15.1* 11.6*  HCT 41.1 46.1* 35.5*  PLT 285 232 310  MCV 89.3 90.9 88.8  MCH 28.9 29.8 29.0  MCHC 32.4 32.8 32.7  RDW 13.2 13.3 13.2  LYMPHSABS  --  0.5* 0.9  MONOABS  --  0.1 0.5  EOSABS  --  0.0 0.0  BASOSABS  --  0.0 0.0    Chemistries  Recent Labs  Lab 01/22/20 1549 01/22/20 2035 01/23/20 0550 01/24/20 0143  NA 139  --  137 135  K 3.3*  --  3.5 3.6  CL 97*  --  100 99  CO2 24  --  25 25  GLUCOSE 189*  --  281* 342*  BUN <5*  --  5* 9  CREATININE 0.96  --  0.87 0.90  CALCIUM 8.7*  --  8.5* 8.8*  AST  --  138* 96* 75*  ALT  --  240* 206* 170*  ALKPHOS  --   75 74 75  BILITOT  --  1.5* 1.4* 0.7   ------------------------------------------------------------------------------------------------------------------ Recent Labs    01/22/20 2035  TRIG 156*    Lab Results  Component Value Date   HGBA1C 6.4 (H) 01/23/2020   ------------------------------------------------------------------------------------------------------------------ No results for input(s): TSH, T4TOTAL, T3FREE, THYROIDAB in the last 72 hours.  Invalid input(s): FREET3 ------------------------------------------------------------------------------------------------------------------ Recent Labs    01/22/20 2035  FERRITIN 524*    Coagulation profile No results for input(s): INR, PROTIME in the last 168 hours.  Recent Labs    01/23/20 0550 01/24/20 0143  DDIMER 1.13* 0.39    Cardiac Enzymes No results for input(s): CKMB, TROPONINI, MYOGLOBIN in the last 168 hours.  Invalid input(s): CK ------------------------------------------------------------------------------------------------------------------ No results found for: BNP  Inpatient Medications  Scheduled Meds: . vitamin C  500 mg Oral Daily  . enoxaparin (LOVENOX) injection  60 mg Subcutaneous Q24H  . insulin aspart  0-9 Units Subcutaneous TID WC  . insulin aspart  3 Units Subcutaneous TID WC  . insulin glargine  10 Units Subcutaneous Daily  . mouth rinse  15 mL Mouth Rinse BID  . methylPREDNISolone (SOLU-MEDROL) injection  75 mg Intravenous Q12H   Followed by  . [START ON 01/26/2020] predniSONE  50 mg Oral Daily  . pantoprazole  40 mg Oral Daily  . sodium chloride flush  3 mL Intravenous Once  . zinc sulfate  220 mg Oral Daily   Continuous Infusions: . remdesivir 100 mg in NS 100 mL Stopped (01/24/20 0900)   PRN Meds:.acetaminophen **OR** acetaminophen, guaiFENesin-dextromethorphan, ondansetron **OR** ondansetron (ZOFRAN) IV  Micro Results Recent Results (from the past 240 hour(s))  SARS  Coronavirus 2 by RT PCR (hospital order, performed in [facility] hospital lab) Nasopharyngeal Nasopharyngeal Swab     Status: None   Collection Time: 01/22/20  8:05 PM   Specimen: Nasopharyngeal Swab  Result Value Ref Range Status   SARS Coronavirus 2 NEGATIVE NEGATIVE Final    Comment: (NOTE) SARS-CoV-2 target nucleic acids are NOT DETECTED.  The SARS-CoV-2 RNA is generally detectable in upper and lower respiratory specimens during the acute phase of infection. The lowest concentration of SARS-CoV-2 viral copies this assay can detect is 250 copies / mL. A negative result does not preclude SARS-CoV-2 infection and should not be used as the sole basis for treatment or other patient management decisions.  A negative result may occur with improper specimen collection /  handling, submission of specimen other than nasopharyngeal swab, presence of viral mutation(s) within the areas targeted by this assay, and inadequate number of viral copies (<250 copies / mL). A negative result must be combined with clinical observations, patient history, and epidemiological information.  Fact Sheet for Patients:   BoilerBrush.com.cy  Fact Sheet for Healthcare Providers: https://pope.com/  This test is not yet approved or  cleared by the [place] FDA and has been authorized for detection and/or diagnosis of SARS-CoV-2 by FDA under an Emergency Use Authorization (EUA).  This EUA will remain in effect (meaning this test can be used) for the duration of the COVID-19 declaration under Section 564(b)(1) of the Act, 21 U.S.C. section 360bbb-3(b)(1), unless the authorization is terminated or revoked sooner.  Performed at [facility], [address], [place], [place] [address]   Blood Culture (routine x 2)     Status: None (Preliminary result)   Collection Time: 01/22/20  8:53 PM   Specimen: BLOOD  Result Value Ref Range Status   Specimen  Description BLOOD RIGHT ANTECUBITAL  Final   Special Requests   Final    BOTTLES DRAWN AEROBIC AND ANAEROBIC Blood Culture adequate volume   Culture   Final    NO GROWTH 2 DAYS Performed at [facility], [address], [place], [place] [address]    Report Status PENDING  Incomplete  Blood Culture (routine x 2)     Status: None (Preliminary result)   Collection Time: 01/23/20  7:43 AM   Specimen: BLOOD  Result Value Ref Range Status   Specimen Description BLOOD LEFT ANTECUBITAL  Final   Special Requests   Final    BOTTLES DRAWN AEROBIC AND ANAEROBIC Blood Culture adequate volume   Culture   Final    NO GROWTH < 24 HOURS Performed at [facility], [address] [place], [place] [address]    Report Status PENDING  Incomplete  Respiratory Panel by RT PCR (Flu A&B, Covid) - Nasopharyngeal Swab     Status: Abnormal   Collection Time: 01/23/20  8:40 AM   Specimen: Nasopharyngeal Swab  Result Value Ref Range Status   SARS Coronavirus 2 by RT PCR POSITIVE (A) NEGATIVE Final    Comment: emailed L. Berdik RN 10:20 01/23/20 ([email or web address]) (NOTE) SARS-CoV-2 target nucleic acids are DETECTED.  SARS-CoV-2 RNA is generally detectable in upper respiratory specimens  during the acute phase of infection. Positive results are indicative of the presence of the identified virus, but do not rule out bacterial infection or co-infection with other pathogens not detected by the test. Clinical correlation with patient history and other diagnostic information is necessary to determine patient infection status. The expected result is Negative.  Fact Sheet for Patients:  https://www.moore.com/  Fact Sheet for Healthcare Providers: https://www.young.biz/  This test is not yet approved or cleared by the [place] FDA and  has been authorized for detection and/or diagnosis of SARS-CoV-2 by FDA under an Emergency Use Authorization (EUA).  This EUA  will remain in effect (meaning this test can be used) for the duration of  the COVID-19  declaration under Section 564(b)(1) of the Act, 21 U.S.C. section 360bbb-3(b)(1), unless the authorization is terminated or revoked sooner.      Influenza A by PCR NEGATIVE NEGATIVE Final   Influenza B by PCR NEGATIVE NEGATIVE Final    Comment: (NOTE) The Xpert Xpress SARS-CoV-2/FLU/RSV assay is intended as an aid in  the diagnosis of influenza from Nasopharyngeal swab  specimens and  should not be used as a sole basis for treatment. Nasal washings and  aspirates are unacceptable for Xpert Xpress SARS-CoV-2/FLU/RSV  testing.  Fact Sheet for Patients: https://www.moore.com/  Fact Sheet for Healthcare Providers: https://www.young.biz/  This test is not yet approved or cleared by the [place] FDA and  has been authorized for detection and/or diagnosis of SARS-CoV-2 by  FDA under an Emergency Use Authorization (EUA). This EUA will remain  in effect (meaning this test can be used) for the duration of the  Covid-19 declaration under Section 564(b)(1) of the Act, 21  U.S.C. section 360bbb-3(b)(1), unless the authorization is  terminated or revoked. Performed at [facility], [address], [place], [place] [address]     Radiology Reports DG Chest Portable 1 View  Result Date: 01/22/2020 CLINICAL DATA:  Cough and shortness of breath. COVID positive yesterday. EXAM: PORTABLE CHEST 1 VIEW COMPARISON:  Chest radiograph 08/25/2014 FINDINGS: Patchy heterogeneous bilateral airspace opacities in a mid-lower lung zone predominant distribution, moderate parenchymal involvement. The heart is normal in size. Normal mediastinal contours. No pneumothorax or evidence of pneumomediastinum. No significant pleural effusion. No acute osseous abnormalities are seen. IMPRESSION: Bilateral patchy heterogeneous airspace opacities consistent with multifocal pneumonia,  pattern typical of COVID-19. Electronically Signed   By: Narda Rutherford M.D.   On: 01/22/2020 16:21     Huey Bienenstock M.D on 01/24/2020 at 2:40 PM    [facility] -  Office  [phone]

## 2020-01-25 LAB — CBC WITH DIFFERENTIAL/PLATELET
Abs Immature Granulocytes: 0.08 10*3/uL — ABNORMAL HIGH (ref 0.00–0.07)
Basophils Absolute: 0 10*3/uL (ref 0.0–0.1)
Basophils Relative: 0 %
Eosinophils Absolute: 0 10*3/uL (ref 0.0–0.5)
Eosinophils Relative: 0 %
HCT: 36.8 % (ref 36.0–46.0)
Hemoglobin: 11.9 g/dL — ABNORMAL LOW (ref 12.0–15.0)
Immature Granulocytes: 1 %
Lymphocytes Relative: 6 %
Lymphs Abs: 0.8 10*3/uL (ref 0.7–4.0)
MCH: 28.9 pg (ref 26.0–34.0)
MCHC: 32.3 g/dL (ref 30.0–36.0)
MCV: 89.3 fL (ref 80.0–100.0)
Monocytes Absolute: 0.5 10*3/uL (ref 0.1–1.0)
Monocytes Relative: 4 %
Neutro Abs: 12 10*3/uL — ABNORMAL HIGH (ref 1.7–7.7)
Neutrophils Relative %: 89 %
Platelets: 409 10*3/uL — ABNORMAL HIGH (ref 150–400)
RBC: 4.12 MIL/uL (ref 3.87–5.11)
RDW: 13.4 % (ref 11.5–15.5)
WBC: 13.3 10*3/uL — ABNORMAL HIGH (ref 4.0–10.5)
nRBC: 0 % (ref 0.0–0.2)

## 2020-01-25 LAB — COMPREHENSIVE METABOLIC PANEL
ALT: 127 U/L — ABNORMAL HIGH (ref 0–44)
AST: 41 U/L (ref 15–41)
Albumin: 2.7 g/dL — ABNORMAL LOW (ref 3.5–5.0)
Alkaline Phosphatase: 69 U/L (ref 38–126)
Anion gap: 14 (ref 5–15)
BUN: 17 mg/dL (ref 6–20)
CO2: 24 mmol/L (ref 22–32)
Calcium: 8.9 mg/dL (ref 8.9–10.3)
Chloride: 97 mmol/L — ABNORMAL LOW (ref 98–111)
Creatinine, Ser: 0.91 mg/dL (ref 0.44–1.00)
GFR calc Af Amer: >60 mL/min (ref >60)
GFR calc non Af Amer: >60 mL/min (ref >60)
Glucose, Bld: 288 mg/dL — ABNORMAL HIGH (ref 70–99)
Potassium: 4 mmol/L (ref 3.5–5.1)
Sodium: 135 mmol/L (ref 135–145)
Total Bilirubin: 0.6 mg/dL (ref 0.3–1.2)
Total Protein: 6.8 g/dL (ref 6.5–8.1)

## 2020-01-25 LAB — D-DIMER, QUANTITATIVE: D-Dimer, Quant: 0.38 ug/mL-FEU (ref 0.00–0.50)

## 2020-01-25 LAB — GLUCOSE, CAPILLARY: Glucose-Capillary: 234 mg/dL — ABNORMAL HIGH (ref 70–99)

## 2020-01-25 LAB — C-REACTIVE PROTEIN: CRP: 0.9 mg/dL (ref <1.0)

## 2020-01-25 MED ORDER — INSULIN GLARGINE 100 UNIT/ML ~~LOC~~ SOLN
15.0000 [IU] | Freq: Every day | SUBCUTANEOUS | Status: DC
  Administered 2020-01-26 – 2020-01-28 (×2): 15 [IU] via SUBCUTANEOUS
  Filled 2020-01-25 (×5): qty 0.15

## 2020-01-25 MED ORDER — INSULIN ASPART 100 UNIT/ML ~~LOC~~ SOLN
0.0000 [IU] | Freq: Three times a day (TID) | SUBCUTANEOUS | Status: DC
  Administered 2020-01-25 – 2020-01-26 (×2): 11 [IU] via SUBCUTANEOUS
  Administered 2020-01-26 (×2): 4 [IU] via SUBCUTANEOUS
  Administered 2020-01-27 (×2): 3 [IU] via SUBCUTANEOUS
  Administered 2020-01-27 – 2020-01-28 (×2): 4 [IU] via SUBCUTANEOUS

## 2020-01-25 MED ORDER — INSULIN ASPART 100 UNIT/ML ~~LOC~~ SOLN
10.0000 [IU] | Freq: Three times a day (TID) | SUBCUTANEOUS | Status: DC
  Administered 2020-01-25 – 2020-01-28 (×8): 10 [IU] via SUBCUTANEOUS

## 2020-01-25 NOTE — Progress Notes (Signed)
Inpatient Diabetes Program Recommendations  AACE/ADA: New Consensus Statement on Inpatient Glycemic Control   Target Ranges:  Prepandial:   less than 140 mg/dL      Peak postprandial:   less than 180 mg/dL (1-2 hours)      Critically ill patients:  140 - 180 mg/dL   Results for INAYA, GILLHAM (MRN [id number]) as of 01/25/2020 10:11  Ref. Range 01/24/2020 07:48 01/24/2020 11:59 01/24/2020 16:33 01/25/2020 07:23  Glucose-Capillary Latest Ref Range: 70 - 99 mg/dL 482 (H) 500 (H) 370 (H) 234 (H)   Review of Glycemic Control  Diabetes history: No Outpatient Diabetes medications: NA Current orders for Inpatient glycemic control: Lantus 10 units daily, Novolog 0-9 units TID with meals, Novolog 6 units TID with meals, Tradjenta 5 mg daily; Solumedrol 75 mg Q12H  Inpatient Diabetes Program Recommendations:    Insulin: If steroids are continued as ordered, please consider increasing Lantus to 15 units daily and meal coverage to Novolog 8 units TID with meals.  Thanks, Orlando Penner, RN, MSN, CDE Diabetes Coordinator Inpatient Diabetes Program [phone] (Team Pager from 8am to 5pm)

## 2020-01-25 NOTE — Progress Notes (Signed)
PROGRESS NOTE                                                                                                                                                                                                             Patient Demographics:    Mckenzie Miller, is a 34 y.o. female, DOB - 1985/06/17, QVZ:[id number]  Outpatient Primary MD for the patient is Patient, No Pcp Per   Admit date - 01/22/2020   LOS - 3  Chief Complaint  Patient presents with  . Covid Positive  . Shortness of Breath       Brief Narrative: Patient is a 34 y.o. female with PMHx of obesity-diagnosed with COVID-19 on September 20/2021-presented with worsening shortness of breath-found to have acute hypoxic respiratory failure due to COVID-19 pneumonia.   COVID-19 vaccinated status: Unvaccinated  Significant Events: 9/26>> Admit to [facility] for hypoxia due to COVID-19  Significant studies: 9/26>>Chest x-ray: Bilateral patchy airspace disease  COVID-19 medications: Steroids: 9/26>> Remdesivir: 9/27>>  Antibiotics: None  Microbiology data: 9/26>> blood culture: Negative 9/27 >>blood culture: Negative  Procedures: None  Consults: None  DVT prophylaxis:  Lovenox  at prophylactic dosing    Subjective:    [name] today feels better-does get short of breath with minimal ambulation.  At rest around 2 L-but with mobility requires around 5 L.   Assessment  & Plan :   Acute Hypoxic Resp Failure due to Covid 19 Viral pneumonia: Slowly improving-continues to require anywhere from 2 to 5 L of oxygen-still has exertional dyspnea-continue steroids/remdesivir.  No indication to start baricitinib at this point.  Fever: afebrile O2 requirements:  SpO2: 90 % O2 Flow Rate (L/min): 2 L/min   COVID-19 Labs: Recent Labs    01/22/20 2035 01/22/20 2035 01/23/20 0550 01/24/20 0143 01/25/20 0841  DDIMER 1.02*   < > 1.13* 0.39 0.38  FERRITIN  524*  --   --   --   --   LDH 731*  --   --   --   --   CRP 9.8*   < > 12.8* 8.7* 0.9   < > = values in this interval not displayed.    No results found for: BNP  Recent Labs  Lab 01/22/20 2035 01/23/20 0550  PROCALCITON 0.13 0.10    Lab Results  Component Value Date   SARSCOV2NAA  POSITIVE (A) 01/23/2020   SARSCOV2NAA NEGATIVE 01/22/2020     Prone/Incentive Spirometry: encouraged  incentive spirometry use 3-4/hour.  Transaminitis: Secondary to COVID-19/Remdesivir-downtrending-follow for now.  Prediabetes (A1c 6.4 on 9/27) with steroid-induced hyperglycemia: CBGs remain uncontrolled-increase Lantus to 15 units daily, increase scheduled Premeal NovoLog to 10 units-change SSI to resistant scale.  Follow and adjust.  Recent Labs    01/24/20 1159 01/24/20 1633 01/25/20 0723  GLUCAP 305* 261* 234*   Morbid Obesity: Estimated body mass index is 47.19 kg/m as calculated from the following:   Height as of this encounter: 5\' 5"  (1.651 m).   Weight as of this encounter: 128.6 kg.    ABG:    Component Value Date/Time   TCO2 21 08/25/2014 1953    Vent Settings: N/A  Condition - Stable  Family Communication  : Patient to update family herself.  Code Status :  Full Code  Diet :  Diet Order            Diet Carb Modified Fluid consistency: Thin; Room service appropriate? Yes  Diet effective now                  Disposition Plan  :   Status is: Inpatient  Remains inpatient appropriate because:Inpatient level of care appropriate due to severity of illness   Dispo: The patient is from: Home              Anticipated d/c is to: Home              Anticipated d/c date is: 2 days              Patient currently is not medically stable to d/c.    Barriers to discharge: Hypoxia requiring O2 supplementation/complete 5 days of IV Remdesivir  Antimicorbials  :    Anti-infectives (From admission, onward)   Start     Dose/Rate Route Frequency Ordered Stop   01/24/20  1000  remdesivir 100 mg in sodium chloride 0.9 % 100 mL IVPB       "Followed by" Linked Group Details   100 mg 200 mL/hr over 30 Minutes Intravenous Daily 01/23/20 0524 01/28/20 0959   01/23/20 0630  remdesivir 200 mg in sodium chloride 0.9% 250 mL IVPB       "Followed by" Linked Group Details   200 mg 580 mL/hr over 30 Minutes Intravenous Once 01/23/20 0524 01/23/20 0824      Inpatient Medications  Scheduled Meds: . vitamin C  500 mg Oral Daily  . enoxaparin (LOVENOX) injection  60 mg Subcutaneous Q24H  . insulin aspart  0-20 Units Subcutaneous TID WC  . insulin aspart  10 Units Subcutaneous TID WC  . [START ON 01/26/2020] insulin glargine  15 Units Subcutaneous Daily  . linagliptin  5 mg Oral Daily  . mouth rinse  15 mL Mouth Rinse BID  . methylPREDNISolone (SOLU-MEDROL) injection  75 mg Intravenous Q12H   Followed by  . [START ON 01/26/2020] predniSONE  50 mg Oral Daily  . pantoprazole  40 mg Oral Daily  . sodium chloride flush  3 mL Intravenous Once  . zinc sulfate  220 mg Oral Daily   Continuous Infusions: . remdesivir 100 mg in NS 100 mL Stopped (01/25/20 1000)   PRN Meds:.acetaminophen **OR** acetaminophen, guaiFENesin-dextromethorphan, ondansetron **OR** ondansetron (ZOFRAN) IV   Time Spent in minutes  25  See all Orders from today for further details   01/27/20 M.D on 01/25/2020 at 1:58 PM  To  page go to [email or web address] - use universal password  [facility] -  Office  [phone]    Objective:   Vitals:   01/24/20 0821 01/24/20 1326 01/24/20 1900 01/25/20 0508  BP:  125/80 131/83 130/77  Pulse:  72 76 82  Resp:  12 15   Temp:  98.3 F (36.8 C) 98.3 F (36.8 C) 98.5 F (36.9 C)  TempSrc:  Axillary Axillary Axillary  SpO2: 95% 96% 95% 90%  Weight:      Height:        Wt Readings from Last 3 Encounters:  01/23/20 128.6 kg  12/24/16 (!) 152.2 kg  08/25/14 (!) 149.2 kg     Intake/Output Summary (Last 24 hours) at 01/25/2020 1358 Last  data filed at 01/25/2020 1000 Gross per 24 hour  Intake 220 ml  Output --  Net 220 ml     Physical Exam Gen Exam:Alert awake-not in any distress HEENT:atraumatic, normocephalic Chest: B/L clear to auscultation anteriorly CVS:S1S2 regular Abdomen:soft non tender, non distended Extremities:no edema Neurology: Non focal Skin: no rash   Data Review:    CBC Recent Labs  Lab 01/22/20 1549 01/23/20 0550 01/24/20 0143 01/25/20 0841  WBC 6.1 2.9* 13.3* 13.3*  HGB 13.3 15.1* 11.6* 11.9*  HCT 41.1 46.1* 35.5* 36.8  PLT 285 232 310 409*  MCV 89.3 90.9 88.8 89.3  MCH 28.9 29.8 29.0 28.9  MCHC 32.4 32.8 32.7 32.3  RDW 13.2 13.3 13.2 13.4  LYMPHSABS  --  0.5* 0.9 0.8  MONOABS  --  0.1 0.5 0.5  EOSABS  --  0.0 0.0 0.0  BASOSABS  --  0.0 0.0 0.0    Chemistries  Recent Labs  Lab 01/22/20 1549 01/22/20 2035 01/23/20 0550 01/24/20 0143 01/25/20 0841  NA 139  --  137 135 135  K 3.3*  --  3.5 3.6 4.0  CL 97*  --  100 99 97*  CO2 24  --  25 25 24   GLUCOSE 189*  --  281* 342* 288*  BUN <5*  --  5* 9 17  CREATININE 0.96  --  0.87 0.90 0.91  CALCIUM 8.7*  --  8.5* 8.8* 8.9  AST  --  138* 96* 75* 41  ALT  --  240* 206* 170* 127*  ALKPHOS  --  75 74 75 69  BILITOT  --  1.5* 1.4* 0.7 0.6   ------------------------------------------------------------------------------------------------------------------ Recent Labs    01/22/20 2035  TRIG 156*    Lab Results  Component Value Date   HGBA1C 6.4 (H) 01/23/2020   ------------------------------------------------------------------------------------------------------------------ No results for input(s): TSH, T4TOTAL, T3FREE, THYROIDAB in the last 72 hours.  Invalid input(s): FREET3 ------------------------------------------------------------------------------------------------------------------ Recent Labs    01/22/20 2035  FERRITIN 524*    Coagulation profile No results for input(s): INR, PROTIME in the last 168  hours.  Recent Labs    01/24/20 0143 01/25/20 0841  DDIMER 0.39 0.38    Cardiac Enzymes No results for input(s): CKMB, TROPONINI, MYOGLOBIN in the last 168 hours.  Invalid input(s): CK ------------------------------------------------------------------------------------------------------------------ No results found for: BNP  Micro Results Recent Results (from the past 240 hour(s))  SARS Coronavirus 2 by RT PCR (hospital order, performed in [facility] hospital lab) Nasopharyngeal Nasopharyngeal Swab     Status: None   Collection Time: 01/22/20  8:05 PM   Specimen: Nasopharyngeal Swab  Result Value Ref Range Status   SARS Coronavirus 2 NEGATIVE NEGATIVE Final    Comment: (NOTE) SARS-CoV-2 target nucleic acids are NOT DETECTED.  The SARS-CoV-2 RNA is generally detectable in upper and lower respiratory specimens during the acute phase of infection. The lowest concentration of SARS-CoV-2 viral copies this assay can detect is 250 copies / mL. A negative result does not preclude SARS-CoV-2 infection and should not be used as the sole basis for treatment or other patient management decisions.  A negative result may occur with improper specimen collection / handling, submission of specimen other than nasopharyngeal swab, presence of viral mutation(s) within the areas targeted by this assay, and inadequate number of viral copies (<250 copies / mL). A negative result must be combined with clinical observations, patient history, and epidemiological information.  Fact Sheet for Patients:   BoilerBrush.com.cy  Fact Sheet for Healthcare Providers: https://pope.com/  This test is not yet approved or  cleared by the [place] FDA and has been authorized for detection and/or diagnosis of SARS-CoV-2 by FDA under an Emergency Use Authorization (EUA).  This EUA will remain in effect (meaning this test can be used) for the duration of  the COVID-19 declaration under Section 564(b)(1) of the Act, 21 U.S.C. section 360bbb-3(b)(1), unless the authorization is terminated or revoked sooner.  Performed at [facility], [address] [place], [place] [address]   Blood Culture (routine x 2)     Status: None (Preliminary result)   Collection Time: 01/22/20  8:53 PM   Specimen: BLOOD  Result Value Ref Range Status   Specimen Description BLOOD RIGHT ANTECUBITAL  Final   Special Requests   Final    BOTTLES DRAWN AEROBIC AND ANAEROBIC Blood Culture adequate volume   Culture   Final    NO GROWTH 3 DAYS Performed at [facility], [address] [place], [place] [address]    Report Status PENDING  Incomplete  Blood Culture (routine x 2)     Status: None (Preliminary result)   Collection Time: 01/23/20  7:43 AM   Specimen: BLOOD  Result Value Ref Range Status   Specimen Description BLOOD LEFT ANTECUBITAL  Final   Special Requests   Final    BOTTLES DRAWN AEROBIC AND ANAEROBIC Blood Culture adequate volume   Culture   Final    NO GROWTH 2 DAYS Performed at [facility], [address], [place], [place] [address]    Report Status PENDING  Incomplete  Respiratory Panel by RT PCR (Flu A&B, Covid) - Nasopharyngeal Swab     Status: Abnormal   Collection Time: 01/23/20  8:40 AM   Specimen: Nasopharyngeal Swab  Result Value Ref Range Status   SARS Coronavirus 2 by RT PCR POSITIVE (A) NEGATIVE Final    Comment: emailed L. Berdik RN 10:20 01/23/20 ([email or web address]) (NOTE) SARS-CoV-2 target nucleic acids are DETECTED.  SARS-CoV-2 RNA is generally detectable in upper respiratory specimens  during the acute phase of infection. Positive results are indicative of the presence of the identified virus, but do not rule out bacterial infection or co-infection with other pathogens not detected by the test. Clinical correlation with patient history and other diagnostic information is necessary to determine patient infection  status. The expected result is Negative.  Fact Sheet for Patients:  https://www.moore.com/  Fact Sheet for Healthcare Providers: https://www.young.biz/  This test is not yet approved or cleared by the [place] FDA and  has been authorized for detection and/or diagnosis of SARS-CoV-2 by FDA under an Emergency Use Authorization (EUA).  This EUA will remain in effect (meaning this test can be used) for the duration of  the COVID-19  declaration under Section 564(b)(1) of the Act, 21 U.S.C. section 360bbb-3(b)(1), unless the authorization is terminated or revoked sooner.      Influenza A by PCR NEGATIVE NEGATIVE Final   Influenza B by PCR NEGATIVE NEGATIVE Final    Comment: (NOTE) The Xpert Xpress SARS-CoV-2/FLU/RSV assay is intended as an aid in  the diagnosis of influenza from Nasopharyngeal swab specimens and  should not be used as a sole basis for treatment. Nasal washings and  aspirates are unacceptable for Xpert Xpress SARS-CoV-2/FLU/RSV  testing.  Fact Sheet for Patients: https://www.moore.com/https://www.fda.gov/media/142436/download  Fact Sheet for Healthcare Providers: https://www.young.biz/https://www.fda.gov/media/142435/download  This test is not yet approved or cleared by the [place] FDA and  has been authorized for detection and/or diagnosis of SARS-CoV-2 by  FDA under an Emergency Use Authorization (EUA). This EUA will remain  in effect (meaning this test can be used) for the duration of the  Covid-19 declaration under Section 564(b)(1) of the Act, 21  U.S.C. section 360bbb-3(b)(1), unless the authorization is  terminated or revoked. Performed at [facility], [address] [place], [place] [address]     Radiology Reports DG Chest Portable 1 View  Result Date: 01/22/2020 CLINICAL DATA:  Cough and shortness of breath. COVID positive yesterday. EXAM: PORTABLE CHEST 1 VIEW COMPARISON:  Chest radiograph 08/25/2014 FINDINGS: Patchy heterogeneous  bilateral airspace opacities in a mid-lower lung zone predominant distribution, moderate parenchymal involvement. The heart is normal in size. Normal mediastinal contours. No pneumothorax or evidence of pneumomediastinum. No significant pleural effusion. No acute osseous abnormalities are seen. IMPRESSION: Bilateral patchy heterogeneous airspace opacities consistent with multifocal pneumonia, pattern typical of COVID-19. Electronically Signed   By: Narda RutherfordMelanie  Sanford M.D.   On: 01/22/2020 16:21

## 2020-01-25 NOTE — Care Management (Signed)
Case manager called patient in room due to COVID status.  Updated patient on appointment for COVID clinic on Oct 11 @ 245 pm and that this appointment is very important to attend.  Also discussed with patient that the COVID clinic would be setting up appointment for [facility] and Wellness clinic in order to obtain refills on medications.  The [facility] pharmacy will get medications to patient prior to discharge. Verified patients understanding and patient denies further questions.  No additional needs identified.

## 2020-01-26 ENCOUNTER — Inpatient Hospital Stay (HOSPITAL_COMMUNITY): Payer: HRSA Program

## 2020-01-26 DIAGNOSIS — J9600 Acute respiratory failure, unspecified whether with hypoxia or hypercapnia: Secondary | ICD-10-CM

## 2020-01-26 DIAGNOSIS — J9690 Respiratory failure, unspecified, unspecified whether with hypoxia or hypercapnia: Secondary | ICD-10-CM

## 2020-01-26 DIAGNOSIS — U071 COVID-19: Secondary | ICD-10-CM

## 2020-01-26 LAB — COMPREHENSIVE METABOLIC PANEL
ALT: 104 U/L — ABNORMAL HIGH (ref 0–44)
AST: 39 U/L (ref 15–41)
Albumin: 2.7 g/dL — ABNORMAL LOW (ref 3.5–5.0)
Alkaline Phosphatase: 70 U/L (ref 38–126)
Anion gap: 10 (ref 5–15)
BUN: 18 mg/dL (ref 6–20)
CO2: 26 mmol/L (ref 22–32)
Calcium: 9 mg/dL (ref 8.9–10.3)
Chloride: 101 mmol/L (ref 98–111)
Creatinine, Ser: 0.88 mg/dL (ref 0.44–1.00)
GFR calc Af Amer: >60 mL/min (ref >60)
GFR calc non Af Amer: >60 mL/min (ref >60)
Glucose, Bld: 200 mg/dL — ABNORMAL HIGH (ref 70–99)
Potassium: 3.4 mmol/L — ABNORMAL LOW (ref 3.5–5.1)
Sodium: 137 mmol/L (ref 135–145)
Total Bilirubin: 0.6 mg/dL (ref 0.3–1.2)
Total Protein: 6.7 g/dL (ref 6.5–8.1)

## 2020-01-26 LAB — CBC WITH DIFFERENTIAL/PLATELET
Abs Immature Granulocytes: 0.1 10*3/uL — ABNORMAL HIGH (ref 0.00–0.07)
Basophils Absolute: 0 10*3/uL (ref 0.0–0.1)
Basophils Relative: 0 %
Eosinophils Absolute: 0 10*3/uL (ref 0.0–0.5)
Eosinophils Relative: 0 %
HCT: 37.7 % (ref 36.0–46.0)
Hemoglobin: 12 g/dL (ref 12.0–15.0)
Immature Granulocytes: 1 %
Lymphocytes Relative: 13 %
Lymphs Abs: 1.1 10*3/uL (ref 0.7–4.0)
MCH: 28.6 pg (ref 26.0–34.0)
MCHC: 31.8 g/dL (ref 30.0–36.0)
MCV: 90 fL (ref 80.0–100.0)
Monocytes Absolute: 0.6 10*3/uL (ref 0.1–1.0)
Monocytes Relative: 7 %
Neutro Abs: 6.7 10*3/uL (ref 1.7–7.7)
Neutrophils Relative %: 79 %
Platelets: 390 10*3/uL (ref 150–400)
RBC: 4.19 MIL/uL (ref 3.87–5.11)
RDW: 13.2 % (ref 11.5–15.5)
WBC: 8.4 10*3/uL (ref 4.0–10.5)
nRBC: 0 % (ref 0.0–0.2)

## 2020-01-26 LAB — ECHOCARDIOGRAM LIMITED
Area-P 1/2: 2.18 cm2
Height: 65 in
S' Lateral: 2.8 cm
Weight: 4537.6 oz

## 2020-01-26 LAB — C-REACTIVE PROTEIN: CRP: 0.7 mg/dL (ref <1.0)

## 2020-01-26 LAB — D-DIMER, QUANTITATIVE: D-Dimer, Quant: 0.35 ug/mL-FEU (ref 0.00–0.50)

## 2020-01-26 MED ORDER — FUROSEMIDE 10 MG/ML IJ SOLN
40.0000 mg | Freq: Once | INTRAMUSCULAR | Status: AC
  Administered 2020-01-26: 40 mg via INTRAVENOUS
  Filled 2020-01-26: qty 4

## 2020-01-26 MED ORDER — WHITE PETROLATUM EX OINT
TOPICAL_OINTMENT | CUTANEOUS | Status: AC
  Filled 2020-01-26: qty 28.35

## 2020-01-26 MED ORDER — POTASSIUM CHLORIDE CRYS ER 20 MEQ PO TBCR
40.0000 meq | EXTENDED_RELEASE_TABLET | Freq: Once | ORAL | Status: AC
  Administered 2020-01-26: 40 meq via ORAL
  Filled 2020-01-26: qty 2

## 2020-01-26 MED ORDER — METHYLPREDNISOLONE SODIUM SUCC 40 MG IJ SOLR
40.0000 mg | Freq: Two times a day (BID) | INTRAMUSCULAR | Status: DC
  Administered 2020-01-26 – 2020-01-28 (×4): 40 mg via INTRAVENOUS
  Filled 2020-01-26 (×4): qty 1

## 2020-01-26 NOTE — Progress Notes (Signed)
  Echocardiogram 2D Echocardiogram has been performed.  Mckenzie Miller 01/26/2020, 3:22 PM

## 2020-01-26 NOTE — Progress Notes (Signed)
Attempted walking test with pt wasn't able to complete because pt started a terrible coughing spell that progressively worsened as she walked. Pt unable to amb more than 20 ft without having a coughing spell. Her SpO2 maintained between 92-94% for the first couple of feet while on 4 L/min then it began to dip into the mid 80s while on 6 L/min O2. Provider and case manage made aware.

## 2020-01-26 NOTE — Progress Notes (Addendum)
PROGRESS NOTE                                                                                                                                                                                                             Patient Demographics:    Mckenzie Miller, is a 34 y.o. female, DOB - 1985/10/13, ZOX:[id number]  Outpatient Primary MD for the patient is Patient, No Pcp Per   Admit date - 01/22/2020   LOS - 4  Chief Complaint  Patient presents with  . Covid Positive  . Shortness of Breath       Brief Narrative: Patient is a 34 y.o. female with PMHx of obesity-diagnosed with COVID-19 on September 20-presented with worsening shortness of breath-found to have acute hypoxic respiratory failure due to COVID-19 pneumonia.   COVID-19 vaccinated status: Unvaccinated  Significant Events: 9/26>> Admit to [facility] for hypoxia due to COVID-19  Significant studies: 9/26>>Chest x-ray: Bilateral patchy airspace disease  COVID-19 medications: Steroids: 9/26>> Remdesivir: 9/27>>  Antibiotics: None  Microbiology data: 9/26>> blood culture: Negative 9/27 >>blood culture: Negative  Procedures: None  Consults: None  DVT prophylaxis:  Lovenox  at prophylactic dosing    Subjective:   Overall feels better-requiring anywhere from 3 to 4 L of oxygen at rest-but requiring around 5-6 L of O2 with ambulation.  Continues to have significant shortness of breath with minimal ambulation-even in the room.     Assessment  & Plan :   Acute Hypoxic Resp Failure due to Covid 19 Viral pneumonia: Although she feels better than how she first presented to the hospital-continues to have significant oxygen requirement-3 to 4 L of oxygen at rest-and around 5-to 6 L of oxygen with ambulation.  She claims she gets exertional dyspnea with minimal movement-even walking just a few feet.  Do not think she requires initiation of Actemra or baricitinib at  this time.  Plans are to continue with steroids/Remdesivir-and give her 1 dose of IV Lasix to see if this improves exertional dyspnea.  We will go ahead and check a echocardiogram-and repeat a chest x-ray in a.m.  If her hypoxemia were to worsen-we can certainly contemplate using Actemra or baricitinib.  For now-continue with plans as outlined above-continue to attempt to titrate down FiO2.   Fever: afebrile O2 requirements:  SpO2: 95 % O2 Flow Rate (L/min): 4 L/min  COVID-19 Labs: Recent Labs    01/24/20 0143 01/25/20 0841 01/26/20 0954  DDIMER 0.39 0.38 0.35  CRP 8.7* 0.9 0.7    No results found for: BNP  Recent Labs  Lab 01/22/20 2035 01/23/20 0550  PROCALCITON 0.13 0.10    Lab Results  Component Value Date   SARSCOV2NAA POSITIVE (A) 01/23/2020   SARSCOV2NAA NEGATIVE 01/22/2020     Prone/Incentive Spirometry: encouraged  incentive spirometry use 3-4/hour.  Transaminitis: Secondary to COVID-19/Remdesivir-downtrending-follow for now.  Hypokalemia: Replete and recheck.  Prediabetes (A1c 6.4 on 9/27) with steroid-induced hyperglycemia: CBGs slightly-steroids have been tapered down-continue Lantus 15 units daily, NovoLog 10 units with meals-and resistant SSI.  Follow and adjust accordingly-but suspect that her steroid gets tapered down-she will have less insulin requirement.    Recent Labs    01/24/20 1159 01/24/20 1633 01/25/20 0723  GLUCAP 305* 261* 234*   Morbid Obesity: Estimated body mass index is 47.19 kg/m as calculated from the following:   Height as of this encounter: 5\' 5"  (1.651 m).   Weight as of this encounter: 128.6 kg.    ABG:    Component Value Date/Time   TCO2 21 08/25/2014 1953    Vent Settings: N/A  Condition - Stable  Family Communication  : Patient prefers to update family herself.  Code Status :  Full Code  Diet :  Diet Order            Diet Carb Modified Fluid consistency: Thin; Room service appropriate? Yes  Diet effective  now                  Disposition Plan  :   Status is: Inpatient  Remains inpatient appropriate because:Inpatient level of care appropriate due to severity of illness   Dispo: The patient is from: Home              Anticipated d/c is to: Home              Anticipated d/c date is: 2 days              Patient currently is not medically stable to d/c.    Barriers to discharge: Hypoxia requiring O2 supplementation/complete 5 days of IV Remdesivir  Antimicorbials  :    Anti-infectives (From admission, onward)   Start     Dose/Rate Route Frequency Ordered Stop   01/24/20 1000  remdesivir 100 mg in sodium chloride 0.9 % 100 mL IVPB       "Followed by" Linked Group Details   100 mg 200 mL/hr over 30 Minutes Intravenous Daily 01/23/20 0524 01/28/20 0959   01/23/20 0630  remdesivir 200 mg in sodium chloride 0.9% 250 mL IVPB       "Followed by" Linked Group Details   200 mg 580 mL/hr over 30 Minutes Intravenous Once 01/23/20 0524 01/23/20 0824      Inpatient Medications  Scheduled Meds: . vitamin C  500 mg Oral Daily  . enoxaparin (LOVENOX) injection  60 mg Subcutaneous Q24H  . insulin aspart  0-20 Units Subcutaneous TID WC  . insulin aspart  10 Units Subcutaneous TID WC  . insulin glargine  15 Units Subcutaneous Daily  . linagliptin  5 mg Oral Daily  . mouth rinse  15 mL Mouth Rinse BID  . pantoprazole  40 mg Oral Daily  . predniSONE  50 mg Oral Daily  . sodium chloride flush  3 mL Intravenous Once  . zinc sulfate  220 mg Oral Daily  Continuous Infusions: . remdesivir 100 mg in NS 100 mL Stopped (01/26/20 0852)   PRN Meds:.acetaminophen **OR** acetaminophen, guaiFENesin-dextromethorphan, ondansetron **OR** ondansetron (ZOFRAN) IV   Time Spent in minutes  25  See all Orders from today for further details   Jeoffrey Massed M.D on 01/26/2020 at 2:08 PM  To page go to [email or web address] - use universal password  [facility] -  Office  [phone]    Objective:    Vitals:   01/25/20 0508 01/25/20 1501 01/25/20 2055 01/26/20 0505  BP: 130/77 122/75 128/72 137/79  Pulse: 82 63 60 71  Resp:  20 20 20   Temp: 98.5 F (36.9 C) 97.9 F (36.6 C) 97.8 F (36.6 C) 98 F (36.7 C)  TempSrc: Axillary Oral Oral Oral  SpO2: 90% 97% 96% 95%  Weight:      Height:        Wt Readings from Last 3 Encounters:  01/23/20 128.6 kg  12/24/16 (!) 152.2 kg  08/25/14 (!) 149.2 kg     Intake/Output Summary (Last 24 hours) at 01/26/2020 1408 Last data filed at 01/26/2020 0903 Gross per 24 hour  Intake 220 ml  Output --  Net 220 ml     Physical Exam Gen Exam:Alert awake-not in any distress HEENT:atraumatic, normocephalic Chest: B/L clear to auscultation anteriorly CVS:S1S2 regular Abdomen:soft non tender, non distended Extremities:no edema Neurology: Non focal Skin: no rash   Data Review:    CBC Recent Labs  Lab 01/22/20 1549 01/23/20 0550 01/24/20 0143 01/25/20 0841 01/26/20 0954  WBC 6.1 2.9* 13.3* 13.3* 8.4  HGB 13.3 15.1* 11.6* 11.9* 12.0  HCT 41.1 46.1* 35.5* 36.8 37.7  PLT 285 232 310 409* 390  MCV 89.3 90.9 88.8 89.3 90.0  MCH 28.9 29.8 29.0 28.9 28.6  MCHC 32.4 32.8 32.7 32.3 31.8  RDW 13.2 13.3 13.2 13.4 13.2  LYMPHSABS  --  0.5* 0.9 0.8 1.1  MONOABS  --  0.1 0.5 0.5 0.6  EOSABS  --  0.0 0.0 0.0 0.0  BASOSABS  --  0.0 0.0 0.0 0.0    Chemistries  Recent Labs  Lab 01/22/20 1549 01/22/20 2035 01/23/20 0550 01/24/20 0143 01/25/20 0841 01/26/20 0954  NA 139  --  137 135 135 137  K 3.3*  --  3.5 3.6 4.0 3.4*  CL 97*  --  100 99 97* 101  CO2 24  --  25 25 24 26   GLUCOSE 189*  --  281* 342* 288* 200*  BUN <5*  --  5* 9 17 18   CREATININE 0.96  --  0.87 0.90 0.91 0.88  CALCIUM 8.7*  --  8.5* 8.8* 8.9 9.0  AST  --  138* 96* 75* 41 39  ALT  --  240* 206* 170* 127* 104*  ALKPHOS  --  75 74 75 69 70  BILITOT  --  1.5* 1.4* 0.7 0.6 0.6    ------------------------------------------------------------------------------------------------------------------ No results for input(s): CHOL, HDL, LDLCALC, TRIG, CHOLHDL, LDLDIRECT in the last 72 hours.  Lab Results  Component Value Date   HGBA1C 6.4 (H) 01/23/2020   ------------------------------------------------------------------------------------------------------------------ No results for input(s): TSH, T4TOTAL, T3FREE, THYROIDAB in the last 72 hours.  Invalid input(s): FREET3 ------------------------------------------------------------------------------------------------------------------ No results for input(s): VITAMINB12, FOLATE, FERRITIN, TIBC, IRON, RETICCTPCT in the last 72 hours.  Coagulation profile No results for input(s): INR, PROTIME in the last 168 hours.  Recent Labs    01/25/20 0841 01/26/20 0954  DDIMER 0.38 0.35    Cardiac Enzymes No results for input(s): CKMB,  TROPONINI, MYOGLOBIN in the last 168 hours.  Invalid input(s): CK ------------------------------------------------------------------------------------------------------------------ No results found for: BNP  Micro Results Recent Results (from the past 240 hour(s))  SARS Coronavirus 2 by RT PCR (hospital order, performed in [facility] hospital lab) Nasopharyngeal Nasopharyngeal Swab     Status: None   Collection Time: 01/22/20  8:05 PM   Specimen: Nasopharyngeal Swab  Result Value Ref Range Status   SARS Coronavirus 2 NEGATIVE NEGATIVE Final    Comment: (NOTE) SARS-CoV-2 target nucleic acids are NOT DETECTED.  The SARS-CoV-2 RNA is generally detectable in upper and lower respiratory specimens during the acute phase of infection. The lowest concentration of SARS-CoV-2 viral copies this assay can detect is 250 copies / mL. A negative result does not preclude SARS-CoV-2 infection and should not be used as the sole basis for treatment or other patient management decisions.  A negative  result may occur with improper specimen collection / handling, submission of specimen other than nasopharyngeal swab, presence of viral mutation(s) within the areas targeted by this assay, and inadequate number of viral copies (<250 copies / mL). A negative result must be combined with clinical observations, patient history, and epidemiological information.  Fact Sheet for Patients:   BoilerBrush.com.cy  Fact Sheet for Healthcare Providers: https://pope.com/  This test is not yet approved or  cleared by the [place] FDA and has been authorized for detection and/or diagnosis of SARS-CoV-2 by FDA under an Emergency Use Authorization (EUA).  This EUA will remain in effect (meaning this test can be used) for the duration of the COVID-19 declaration under Section 564(b)(1) of the Act, 21 U.S.C. section 360bbb-3(b)(1), unless the authorization is terminated or revoked sooner.  Performed at [facility], [address] [place], [place] [address]   Blood Culture (routine x 2)     Status: None (Preliminary result)   Collection Time: 01/22/20  8:53 PM   Specimen: BLOOD  Result Value Ref Range Status   Specimen Description BLOOD RIGHT ANTECUBITAL  Final   Special Requests   Final    BOTTLES DRAWN AEROBIC AND ANAEROBIC Blood Culture adequate volume   Culture   Final    NO GROWTH 4 DAYS Performed at [facility], [address] [place], [place] [address]    Report Status PENDING  Incomplete  Blood Culture (routine x 2)     Status: None (Preliminary result)   Collection Time: 01/23/20  7:43 AM   Specimen: BLOOD  Result Value Ref Range Status   Specimen Description BLOOD LEFT ANTECUBITAL  Final   Special Requests   Final    BOTTLES DRAWN AEROBIC AND ANAEROBIC Blood Culture adequate volume   Culture   Final    NO GROWTH 3 DAYS Performed at [facility], [address], [place], [place] [address]    Report Status  PENDING  Incomplete  Respiratory Panel by RT PCR (Flu A&B, Covid) - Nasopharyngeal Swab     Status: Abnormal   Collection Time: 01/23/20  8:40 AM   Specimen: Nasopharyngeal Swab  Result Value Ref Range Status   SARS Coronavirus 2 by RT PCR POSITIVE (A) NEGATIVE Final    Comment: emailed L. Berdik RN 10:20 01/23/20 ([email or web address]) (NOTE) SARS-CoV-2 target nucleic acids are DETECTED.  SARS-CoV-2 RNA is generally detectable in upper respiratory specimens  during the acute phase of infection. Positive results are indicative of the presence of the identified virus, but do not rule out bacterial infection or co-infection with other  pathogens not detected by the test. Clinical correlation with patient history and other diagnostic information is necessary to determine patient infection status. The expected result is Negative.  Fact Sheet for Patients:  https://www.moore.com/https://www.fda.gov/media/142436/download  Fact Sheet for Healthcare Providers: https://www.young.biz/https://www.fda.gov/media/142435/download  This test is not yet approved or cleared by the [place] FDA and  has been authorized for detection and/or diagnosis of SARS-CoV-2 by FDA under an Emergency Use Authorization (EUA).  This EUA will remain in effect (meaning this test can be used) for the duration of  the COVID-19  declaration under Section 564(b)(1) of the Act, 21 U.S.C. section 360bbb-3(b)(1), unless the authorization is terminated or revoked sooner.      Influenza A by PCR NEGATIVE NEGATIVE Final   Influenza B by PCR NEGATIVE NEGATIVE Final    Comment: (NOTE) The Xpert Xpress SARS-CoV-2/FLU/RSV assay is intended as an aid in  the diagnosis of influenza from Nasopharyngeal swab specimens and  should not be used as a sole basis for treatment. Nasal washings and  aspirates are unacceptable for Xpert Xpress SARS-CoV-2/FLU/RSV  testing.  Fact Sheet for Patients: https://www.moore.com/https://www.fda.gov/media/142436/download  Fact Sheet for Healthcare  Providers: https://www.young.biz/https://www.fda.gov/media/142435/download  This test is not yet approved or cleared by the [place] FDA and  has been authorized for detection and/or diagnosis of SARS-CoV-2 by  FDA under an Emergency Use Authorization (EUA). This EUA will remain  in effect (meaning this test can be used) for the duration of the  Covid-19 declaration under Section 564(b)(1) of the Act, 21  U.S.C. section 360bbb-3(b)(1), unless the authorization is  terminated or revoked. Performed at [facility], [address] [place], [place] [address]     Radiology Reports DG Chest Portable 1 View  Result Date: 01/22/2020 CLINICAL DATA:  Cough and shortness of breath. COVID positive yesterday. EXAM: PORTABLE CHEST 1 VIEW COMPARISON:  Chest radiograph 08/25/2014 FINDINGS: Patchy heterogeneous bilateral airspace opacities in a mid-lower lung zone predominant distribution, moderate parenchymal involvement. The heart is normal in size. Normal mediastinal contours. No pneumothorax or evidence of pneumomediastinum. No significant pleural effusion. No acute osseous abnormalities are seen. IMPRESSION: Bilateral patchy heterogeneous airspace opacities consistent with multifocal pneumonia, pattern typical of COVID-19. Electronically Signed   By: Narda RutherfordMelanie  Sanford M.D.   On: 01/22/2020 16:21

## 2020-01-26 NOTE — Care Management (Signed)
0930 reached out to nursing staff to request walking test, nurse reached back out 1030 to let me know that patient was not able to walk without distress.

## 2020-01-27 ENCOUNTER — Inpatient Hospital Stay (HOSPITAL_COMMUNITY): Payer: HRSA Program

## 2020-01-27 DIAGNOSIS — J1282 Pneumonia due to coronavirus disease 2019: Secondary | ICD-10-CM

## 2020-01-27 DIAGNOSIS — J069 Acute upper respiratory infection, unspecified: Secondary | ICD-10-CM

## 2020-01-27 DIAGNOSIS — U071 COVID-19: Principal | ICD-10-CM

## 2020-01-27 DIAGNOSIS — E119 Type 2 diabetes mellitus without complications: Secondary | ICD-10-CM

## 2020-01-27 LAB — CBC WITH DIFFERENTIAL/PLATELET
Abs Immature Granulocytes: 0.14 10*3/uL — ABNORMAL HIGH (ref 0.00–0.07)
Basophils Absolute: 0 10*3/uL (ref 0.0–0.1)
Basophils Relative: 0 %
Eosinophils Absolute: 0 10*3/uL (ref 0.0–0.5)
Eosinophils Relative: 0 %
HCT: 36.6 % (ref 36.0–46.0)
Hemoglobin: 11.8 g/dL — ABNORMAL LOW (ref 12.0–15.0)
Immature Granulocytes: 2 %
Lymphocytes Relative: 13 %
Lymphs Abs: 1.2 10*3/uL (ref 0.7–4.0)
MCH: 28.9 pg (ref 26.0–34.0)
MCHC: 32.2 g/dL (ref 30.0–36.0)
MCV: 89.7 fL (ref 80.0–100.0)
Monocytes Absolute: 0.7 10*3/uL (ref 0.1–1.0)
Monocytes Relative: 8 %
Neutro Abs: 7.2 10*3/uL (ref 1.7–7.7)
Neutrophils Relative %: 77 %
Platelets: 466 10*3/uL — ABNORMAL HIGH (ref 150–400)
RBC: 4.08 MIL/uL (ref 3.87–5.11)
RDW: 13.2 % (ref 11.5–15.5)
WBC: 9.3 10*3/uL (ref 4.0–10.5)
nRBC: 0 % (ref 0.0–0.2)

## 2020-01-27 LAB — C-REACTIVE PROTEIN: CRP: 0.7 mg/dL (ref <1.0)

## 2020-01-27 LAB — CULTURE, BLOOD (ROUTINE X 2)
Culture: NO GROWTH
Special Requests: ADEQUATE

## 2020-01-27 LAB — COMPREHENSIVE METABOLIC PANEL
ALT: 85 U/L — ABNORMAL HIGH (ref 0–44)
AST: 31 U/L (ref 15–41)
Albumin: 2.6 g/dL — ABNORMAL LOW (ref 3.5–5.0)
Alkaline Phosphatase: 63 U/L (ref 38–126)
Anion gap: 12 (ref 5–15)
BUN: 16 mg/dL (ref 6–20)
CO2: 27 mmol/L (ref 22–32)
Calcium: 9.1 mg/dL (ref 8.9–10.3)
Chloride: 100 mmol/L (ref 98–111)
Creatinine, Ser: 0.92 mg/dL (ref 0.44–1.00)
GFR calc Af Amer: >60 mL/min (ref >60)
GFR calc non Af Amer: >60 mL/min (ref >60)
Glucose, Bld: 153 mg/dL — ABNORMAL HIGH (ref 70–99)
Potassium: 3.9 mmol/L (ref 3.5–5.1)
Sodium: 139 mmol/L (ref 135–145)
Total Bilirubin: 0.7 mg/dL (ref 0.3–1.2)
Total Protein: 6.2 g/dL — ABNORMAL LOW (ref 6.5–8.1)

## 2020-01-27 LAB — D-DIMER, QUANTITATIVE: D-Dimer, Quant: 0.46 ug/mL-FEU (ref 0.00–0.50)

## 2020-01-27 MED ORDER — POTASSIUM CHLORIDE CRYS ER 20 MEQ PO TBCR
40.0000 meq | EXTENDED_RELEASE_TABLET | Freq: Once | ORAL | Status: AC
  Administered 2020-01-27: 40 meq via ORAL
  Filled 2020-01-27: qty 2

## 2020-01-27 MED ORDER — FUROSEMIDE 10 MG/ML IJ SOLN
40.0000 mg | Freq: Once | INTRAMUSCULAR | Status: AC
  Administered 2020-01-27: 40 mg via INTRAVENOUS

## 2020-01-27 NOTE — TOC Progression Note (Signed)
Transition of Care [facility]) - Progression Note    Patient Details  Name: DEVRA STARE MRN: [id number] Date of Birth: 1986/03/15  [facility]) CM/SW Contact  Nance Pear, RN Phone Number: 01/27/2020, 4:22 PM  Clinical Narrative:    Patient will need walking O2 sat in order to request home oxygen orders.  Reached out to RN to complete walking saturation.   Expected Discharge Plan: Home w Home Health Services Barriers to Discharge: Continued Medical Work up  Expected Discharge Plan and Services Expected Discharge Plan: Home w Home Health Services                                               Social Determinants of Health (SDOH) Interventions    Readmission Risk Interventions No flowsheet data found.

## 2020-01-27 NOTE — Care Management (Signed)
Case manger messaged nurse to see if we can get walking sats on this patient.

## 2020-01-27 NOTE — Progress Notes (Signed)
PROGRESS NOTE                                                                                                                                                                                                             Patient Demographics:    Mckenzie Miller, is a 34 y.o. female, DOB - March 09, 1986, JJO:[id number]  Outpatient Primary MD for the patient is Patient, No Pcp Per   Admit date - 01/22/2020   LOS - 5  Chief Complaint  Patient presents with  . Covid Positive  . Shortness of Breath       Brief Narrative: Patient is a 34 y.o. female with PMHx of obesity-diagnosed with COVID-19 on September 20-presented with worsening shortness of breath-found to have acute hypoxic respiratory failure due to COVID-19 pneumonia.   COVID-19 vaccinated status: Unvaccinated  Significant Events: 9/26>> Admit to [facility] for hypoxia due to COVID-19  Significant studies: 9/26>>Chest x-ray: Bilateral patchy airspace disease  COVID-19 medications: Steroids: 9/26>> Remdesivir: 9/27>>  Antibiotics: None  Microbiology data: 9/26>> blood culture: Negative 9/27 >>blood culture: Negative  Procedures: None  Consults: None  DVT prophylaxis:  Lovenox  at prophylactic dosing    Subjective:   Patient states that she is feeling better.  Not as short of breath as before.  Denies nausea vomiting.  Occasionally coughing up blood-tinged sputum.  Chest pain only with the bleeding deep breathing and cough.  None currently.   Assessment  & Plan :   Acute Hypoxic Resp. Failure/Pneumonia due to COVID-19  Recent Labs  Lab 01/22/20 2035 01/22/20 2035 01/23/20 0550 01/24/20 0143 01/25/20 0841 01/26/20 0954 01/27/20 0635  DDIMER 1.02*   < > 1.13* 0.39 0.38 0.35 0.46  FERRITIN 524*  --   --   --   --   --   --   CRP 9.8*   < > 12.8* 8.7* 0.9 0.7 0.7  ALT 240*   < > 206* 170* 127* 104* 85*  PROCALCITON 0.13  --  0.10  --   --   --   --     < > = values in this interval not displayed.    Objective findings: Fever: Noted to be afebrile Oxygen requirements: Nasal cannula 4 L saturating in the early 90s.  COVID 19 Therapeutics: Antibacterials: None.  Procalcitonin 0.1. Remdesivir: We will complete 5-day course  of Remdesivir today. Steroids: On Solu-Medrol Diuretics: We will give Lasix x1 today Actemra/Baricitinib: Did not require PUD Prophylaxis: Protonix DVT Prophylaxis:  Lovenox 60 mg once a day  From a respiratory standpoint patient seems to be subjectively better.  She is still requiring about 4 to 5 L of oxygen.  Tends to desaturate with ambulation.  We will give her Lasix x1 today.  Continue to mobilize.  Continue steroids.  She will complete course of Remdesivir today.  Echocardiogram showed EF 65 to 70%.  No wall motion abnormalities.  Right ventricular systolic function was normal.  No significant valvular disease.  D-dimer is normal.  CRP is normal as well.  Chest x-ray from this morning shows stable disease.  Patient improving.  Likely discharge in 24 to 48 hours.  She will most likely need oxygen at home.  The treatment plan and use of medications and known side effects were discussed with patient/family. Some of the medications used are based on case reports/anecdotal data.  All other medications being used in the management of COVID-19 based on limited study data.  Complete risks and long-term side effects are unknown, however in the best clinical judgment they seem to be of some benefit.  Patient/family wanted to proceed with treatment options provided.  Transaminitis Secondary to COVID-19.  Seems to be improving.  Normocytic anemia Stable.  Hypokalemia Potassium is normal today.  Prediabetes (A1c 6.4 on 9/27) with steroid-induced hyperglycemia CBGs not checked in the last 24 hours.  Glucose level from the metabolic panel was 153.  Patient on Lantus and Tradjenta along with SSI and meal coverage.     Morbid Obesity: Estimated body mass index is 47.19 kg/m as calculated from the following:   Height as of this encounter:  (1.651 m).   Weight as of this encounter: 128.6 kg.   Family Communication  : Patient prefers to update family herself.  Code Status :  Full Code  Diet :  Diet Order            Diet Carb Modified Fluid consistency: Thin; Room service appropriate? Yes  Diet effective now                  Disposition Plan  :   Status is: Inpatient  Remains inpatient appropriate because:Inpatient level of care appropriate due to severity of illness   Dispo: The patient is from: Home              Anticipated d/c is to: Home              Anticipated d/c date is: 2 days              Patient currently is not medically stable to d/c.    Barriers to discharge: Hypoxia requiring O2 supplementation  Antimicorbials  :    Anti-infectives (From admission, onward)   Start     Dose/Rate Route Frequency Ordered Stop   01/24/20 1000  remdesivir 100 mg in sodium chloride 0.9 % 100 mL IVPB       "Followed by" Linked Group Details   100 mg 200 mL/hr over 30 Minutes Intravenous Daily 01/23/20 0524 01/27/20 1010   01/23/20 0630  remdesivir 200 mg in sodium chloride 0.9% 250 mL IVPB       "Followed by" Linked Group Details   200 mg 580 mL/hr over 30 Minutes Intravenous Once 01/23/20 0524 01/23/20 0824      Inpatient Medications  Scheduled Meds: . vitamin C  500 mg Oral Daily  . enoxaparin (LOVENOX) injection  60 mg Subcutaneous Q24H  . insulin aspart  0-20 Units Subcutaneous TID WC  . insulin aspart  10 Units Subcutaneous TID WC  . insulin glargine  15 Units Subcutaneous Daily  . linagliptin  5 mg Oral Daily  . mouth rinse  15 mL Mouth Rinse BID  . methylPREDNISolone (SOLU-MEDROL) injection  40 mg Intravenous Q12H  . pantoprazole  40 mg Oral Daily  . sodium chloride flush  3 mL Intravenous Once  . zinc sulfate  220 mg Oral Daily   Continuous Infusions:  PRN  Meds:.acetaminophen **OR** acetaminophen, guaiFENesin-dextromethorphan, ondansetron **OR** ondansetron (ZOFRAN) IV    Osvaldo Shipper M.D on 01/27/2020 at 12:43 PM  To page go to [email or web address] - use universal password  [facility] -  Office  [phone]    Objective:   Vitals:   01/26/20 0505 01/26/20 1422 01/26/20 2045 01/27/20 0446  BP: 137/79 101/61 119/61 114/76  Pulse: 71 67 (!) 54 65  Resp: 20 18 20 18   Temp: 98 F (36.7 C) 97.8 F (36.6 C) 98.1 F (36.7 C) 97.8 F (36.6 C)  TempSrc: Oral Oral Oral Oral  SpO2: 95% 100% 99% 92%  Weight:      Height:        Wt Readings from Last 3 Encounters:  01/23/20 128.6 kg  12/24/16 (!) 152.2 kg  08/25/14 (!) 149.2 kg     Intake/Output Summary (Last 24 hours) at 01/27/2020 1243 Last data filed at 01/27/2020 0917 Gross per 24 hour  Intake 780 ml  Output --  Net 780 ml     Physical Exam  General appearance: Awake alert.  In no distress.  Morbidly obese Resp: Tachypneic at rest.  No use of accessory muscles.  Crackles bilateral bases.  No wheezing or rhonchi. Cardio: S1-S2 is normal regular.  No S3-S4.  No rubs murmurs or bruit GI: Abdomen is soft.  Nontender nondistended.  Bowel sounds are present normal.  No masses organomegaly Extremities: No edema.  Full range of motion of lower extremities. Neurologic: Alert and oriented x3.  No focal neurological deficits.     Data Review:    CBC Recent Labs  Lab 01/23/20 0550 01/24/20 0143 01/25/20 0841 01/26/20 0954 01/27/20 0635  WBC 2.9* 13.3* 13.3* 8.4 9.3  HGB 15.1* 11.6* 11.9* 12.0 11.8*  HCT 46.1* 35.5* 36.8 37.7 36.6  PLT 232 310 409* 390 466*  MCV 90.9 88.8 89.3 90.0 89.7  MCH 29.8 29.0 28.9 28.6 28.9  MCHC 32.8 32.7 32.3 31.8 32.2  RDW 13.3 13.2 13.4 13.2 13.2  LYMPHSABS 0.5* 0.9 0.8 1.1 1.2  MONOABS 0.1 0.5 0.5 0.6 0.7  EOSABS 0.0 0.0 0.0 0.0 0.0  BASOSABS 0.0 0.0 0.0 0.0 0.0    Chemistries  Recent Labs  Lab 01/23/20 0550 01/24/20 0143  01/25/20 0841 01/26/20 0954 01/27/20 0635  NA 137 135 135 137 139  K 3.5 3.6 4.0 3.4* 3.9  CL 100 99 97* 101 100  CO2 25 25 24 26 27   GLUCOSE 281* 342* 288* 200* 153*  BUN 5* 9 17 18 16   CREATININE 0.87 0.90 0.91 0.88 0.92  CALCIUM 8.5* 8.8* 8.9 9.0 9.1  AST 96* 75* 41 39 31  ALT 206* 170* 127* 104* 85*  ALKPHOS 74 75 69 70 63  BILITOT 1.4* 0.7 0.6 0.6 0.7    Micro Results Recent Results (from the past 240 hour(s))  SARS Coronavirus 2 by RT PCR (hospital order, performed  in [facility] hospital lab) Nasopharyngeal Nasopharyngeal Swab     Status: None   Collection Time: 01/22/20  8:05 PM   Specimen: Nasopharyngeal Swab  Result Value Ref Range Status   SARS Coronavirus 2 NEGATIVE NEGATIVE Final    Comment: (NOTE) SARS-CoV-2 target nucleic acids are NOT DETECTED.  The SARS-CoV-2 RNA is generally detectable in upper and lower respiratory specimens during the acute phase of infection. The lowest concentration of SARS-CoV-2 viral copies this assay can detect is 250 copies / mL. A negative result does not preclude SARS-CoV-2 infection and should not be used as the sole basis for treatment or other patient management decisions.  A negative result may occur with improper specimen collection / handling, submission of specimen other than nasopharyngeal swab, presence of viral mutation(s) within the areas targeted by this assay, and inadequate number of viral copies (<250 copies / mL). A negative result must be combined with clinical observations, patient history, and epidemiological information.  Fact Sheet for Patients:   BoilerBrush.com.cy  Fact Sheet for Healthcare Providers: https://pope.com/  This test is not yet approved or  cleared by the [place] FDA and has been authorized for detection and/or diagnosis of SARS-CoV-2 by FDA under an Emergency Use Authorization (EUA).  This EUA will remain in effect (meaning this test  can be used) for the duration of the COVID-19 declaration under Section 564(b)(1) of the Act, 21 U.S.C. section 360bbb-3(b)(1), unless the authorization is terminated or revoked sooner.  Performed at [facility], [address], [place], [place] [address]   Blood Culture (routine x 2)     Status: None   Collection Time: 01/22/20  8:53 PM   Specimen: BLOOD  Result Value Ref Range Status   Specimen Description BLOOD RIGHT ANTECUBITAL  Final   Special Requests   Final    BOTTLES DRAWN AEROBIC AND ANAEROBIC Blood Culture adequate volume   Culture   Final    NO GROWTH 5 DAYS Performed at [facility], [address], [place], [place] [address]    Report Status 01/27/2020 FINAL  Final  Blood Culture (routine x 2)     Status: None (Preliminary result)   Collection Time: 01/23/20  7:43 AM   Specimen: BLOOD  Result Value Ref Range Status   Specimen Description BLOOD LEFT ANTECUBITAL  Final   Special Requests   Final    BOTTLES DRAWN AEROBIC AND ANAEROBIC Blood Culture adequate volume   Culture   Final    NO GROWTH 4 DAYS Performed at [facility], [address], [place], [place] [address]    Report Status PENDING  Incomplete  Respiratory Panel by RT PCR (Flu A&B, Covid) - Nasopharyngeal Swab     Status: Abnormal   Collection Time: 01/23/20  8:40 AM   Specimen: Nasopharyngeal Swab  Result Value Ref Range Status   SARS Coronavirus 2 by RT PCR POSITIVE (A) NEGATIVE Final    Comment: emailed L. Berdik RN 10:20 01/23/20 ([email or web address]) (NOTE) SARS-CoV-2 target nucleic acids are DETECTED.  SARS-CoV-2 RNA is generally detectable in upper respiratory specimens  during the acute phase of infection. Positive results are indicative of the presence of the identified virus, but do not rule out bacterial infection or co-infection with other pathogens not detected by the test. Clinical correlation with patient history and other diagnostic information is necessary to determine  patient infection status. The expected result is Negative.  Fact Sheet for Patients:  https://www.moore.com/  Fact Sheet for  Healthcare Providers: https://www.young.biz/https://www.fda.gov/media/142435/download  This test is not yet approved or cleared by the [place] FDA and  has been authorized for detection and/or diagnosis of SARS-CoV-2 by FDA under an Emergency Use Authorization (EUA).  This EUA will remain in effect (meaning this test can be used) for the duration of  the COVID-19  declaration under Section 564(b)(1) of the Act, 21 U.S.C. section 360bbb-3(b)(1), unless the authorization is terminated or revoked sooner.      Influenza A by PCR NEGATIVE NEGATIVE Final   Influenza B by PCR NEGATIVE NEGATIVE Final    Comment: (NOTE) The Xpert Xpress SARS-CoV-2/FLU/RSV assay is intended as an aid in  the diagnosis of influenza from Nasopharyngeal swab specimens and  should not be used as a sole basis for treatment. Nasal washings and  aspirates are unacceptable for Xpert Xpress SARS-CoV-2/FLU/RSV  testing.  Fact Sheet for Patients: https://www.moore.com/https://www.fda.gov/media/142436/download  Fact Sheet for Healthcare Providers: https://www.young.biz/https://www.fda.gov/media/142435/download  This test is not yet approved or cleared by the [place] FDA and  has been authorized for detection and/or diagnosis of SARS-CoV-2 by  FDA under an Emergency Use Authorization (EUA). This EUA will remain  in effect (meaning this test can be used) for the duration of the  Covid-19 declaration under Section 564(b)(1) of the Act, 21  U.S.C. section 360bbb-3(b)(1), unless the authorization is  terminated or revoked. Performed at [facility], [address] [place], [place] [address]     Radiology Reports DG Chest [place]  Result Date: 01/27/2020 CLINICAL DATA:  Shortness of breath.  COVID-19 positive EXAM: PORTABLE CHEST 1 VIEW COMPARISON:  January 22, 2020 FINDINGS: There is patchy airspace opacity in  mid and lower lung regions, similar to prior study. No new opacity evident. Heart is upper normal in size with pulmonary vascularity within normal limits. No adenopathy. No bone lesions. IMPRESSION: Patchy airspace opacity in the mid and lower lung regions bilaterally is essentially stable, consistent with atypical organism pneumonia. No new opacity evident. Stable cardiac silhouette. Electronically Signed   By: Bretta BangWilliam  Woodruff III M.D.   On: 01/27/2020 07:57   DG Chest Portable 1 View  Result Date: 01/22/2020 CLINICAL DATA:  Cough and shortness of breath. COVID positive yesterday. EXAM: PORTABLE CHEST 1 VIEW COMPARISON:  Chest radiograph 08/25/2014 FINDINGS: Patchy heterogeneous bilateral airspace opacities in a mid-lower lung zone predominant distribution, moderate parenchymal involvement. The heart is normal in size. Normal mediastinal contours. No pneumothorax or evidence of pneumomediastinum. No significant pleural effusion. No acute osseous abnormalities are seen. IMPRESSION: Bilateral patchy heterogeneous airspace opacities consistent with multifocal pneumonia, pattern typical of COVID-19. Electronically Signed   By: Narda RutherfordMelanie  Sanford M.D.   On: 01/22/2020 16:21   ECHOCARDIOGRAM LIMITED  Result Date: 01/26/2020    ECHOCARDIOGRAM LIMITED REPORT   Patient Name:   [facility]ad[facility]BRITTANY N [facility] Date of Exam: 01/26/2020 Medical Rec #:  [id number]         Height:       65.0 in Accession #:    [id number]        Weight:       283.6 lb Date of Birth:  10/26/1985          BSA:          2.295 m Patient Age:    34 years          BP:           101/61 mmHg Patient Gender: F  HR:           60 bpm. Exam Location:  Inpatient Procedure: Limited Color Doppler, Cardiac Doppler and Limited Echo Indications:    acute respiratory failure 518.82  History:        Patient has no prior history of Echocardiogram examinations.                 Covid; Signs/Symptoms:Shortness of Breath.  Sonographer:    Delcie Roch Referring  Phys: [id number] Dewayne Shorter M GHIMIRE IMPRESSIONS  1. Left ventricular ejection fraction, by estimation, is 65 to 70%. The left ventricle has normal function. The left ventricle has no regional wall motion abnormalities. Left ventricular diastolic parameters were normal.  2. Right ventricular systolic function is normal. The right ventricular size is normal.  3. The mitral valve is normal in structure. Trivial mitral valve regurgitation.  4. The aortic valve is normal in structure. Aortic valve regurgitation is not visualized.  5. The inferior vena cava is dilated in size with >50% respiratory variability, suggesting right atrial pressure of 8 mmHg. Comparison(s): No prior Echocardiogram. FINDINGS  Left Ventricle: Left ventricular ejection fraction, by estimation, is 65 to 70%. The left ventricle has normal function. The left ventricle has no regional wall motion abnormalities. The left ventricular internal cavity size was normal in size. There is  no left ventricular hypertrophy. Left ventricular diastolic parameters were normal. Right Ventricle: The right ventricular size is normal. No increase in right ventricular wall thickness. Right ventricular systolic function is normal. Left Atrium: Left atrial size was normal in size. Right Atrium: Right atrial size was normal in size. Pericardium: There is no evidence of pericardial effusion. Mitral Valve: The mitral valve is normal in structure. Trivial mitral valve regurgitation. Tricuspid Valve: The tricuspid valve is normal in structure. Tricuspid valve regurgitation is trivial. Aortic Valve: The aortic valve is normal in structure. Aortic valve regurgitation is not visualized. Pulmonic Valve: The pulmonic valve was normal in structure. Pulmonic valve regurgitation is trivial. Venous: The inferior vena cava is dilated in size with greater than 50% respiratory variability, suggesting right atrial pressure of 8 mmHg. IAS/Shunts: No atrial level shunt detected by color flow  Doppler. LEFT VENTRICLE PLAX 2D LVIDd:         4.50 cm Diastology LVIDs:         2.80 cm LV e' medial:    10.90 cm/s LV PW:         0.80 cm LV E/e' medial:  8.2 LV IVS:        0.80 cm LV e' lateral:   14.30 cm/s                        LV E/e' lateral: 6.3  IVC IVC diam: 2.30 cm LEFT ATRIUM         Index LA diam:    3.40 cm 1.48 cm/m  AORTIC VALVE LVOT Vmax:   145.00 cm/s LVOT Vmean:  93.200 cm/s LVOT VTI:    0.361 m  AORTA Ao Root diam: 3.00 cm MITRAL VALVE MV Area (PHT): 2.18 cm    SHUNTS MV Decel Time: 348 msec    Systemic VTI: 0.36 m MV E velocity: 89.50 cm/s MV A velocity: 72.40 cm/s MV E/A ratio:  1.24 Laurance Flatten MD Electronically signed by Laurance Flatten MD Signature Date/Time: 01/26/2020/6:18:55 PM    Final

## 2020-01-27 NOTE — Progress Notes (Addendum)
Received orders to transfer patient to 5N22 at 2030. Have attempted report numerous times. Found out they are waiting for a nurse from 6N to come help. 6N nurse floated to 5N at 2300. Attempted report around 2330 with no response. Understand staffing needs. Will continue to try and give report. Patient made aware and is understanding.     01/28/20 @ 0028: RN called from 5N and took report. Patient belongings packed. Pt transferred via wheelchair by NT and receiving 2L oxygen per [place].

## 2020-01-28 LAB — CBC WITH DIFFERENTIAL/PLATELET
Abs Immature Granulocytes: 0.22 10*3/uL — ABNORMAL HIGH (ref 0.00–0.07)
Basophils Absolute: 0 10*3/uL (ref 0.0–0.1)
Basophils Relative: 0 %
Eosinophils Absolute: 0 10*3/uL (ref 0.0–0.5)
Eosinophils Relative: 0 %
HCT: 36.8 % (ref 36.0–46.0)
Hemoglobin: 12 g/dL (ref 12.0–15.0)
Immature Granulocytes: 2 %
Lymphocytes Relative: 15 %
Lymphs Abs: 1.7 10*3/uL (ref 0.7–4.0)
MCH: 29.2 pg (ref 26.0–34.0)
MCHC: 32.6 g/dL (ref 30.0–36.0)
MCV: 89.5 fL (ref 80.0–100.0)
Monocytes Absolute: 0.9 10*3/uL (ref 0.1–1.0)
Monocytes Relative: 8 %
Neutro Abs: 8.1 10*3/uL — ABNORMAL HIGH (ref 1.7–7.7)
Neutrophils Relative %: 75 %
Platelets: 426 10*3/uL — ABNORMAL HIGH (ref 150–400)
RBC: 4.11 MIL/uL (ref 3.87–5.11)
RDW: 13.1 % (ref 11.5–15.5)
WBC: 10.9 10*3/uL — ABNORMAL HIGH (ref 4.0–10.5)
nRBC: 0 % (ref 0.0–0.2)

## 2020-01-28 LAB — COMPREHENSIVE METABOLIC PANEL
ALT: 72 U/L — ABNORMAL HIGH (ref 0–44)
AST: 23 U/L (ref 15–41)
Albumin: 2.6 g/dL — ABNORMAL LOW (ref 3.5–5.0)
Alkaline Phosphatase: 60 U/L (ref 38–126)
Anion gap: 10 (ref 5–15)
BUN: 18 mg/dL (ref 6–20)
CO2: 25 mmol/L (ref 22–32)
Calcium: 8.9 mg/dL (ref 8.9–10.3)
Chloride: 100 mmol/L (ref 98–111)
Creatinine, Ser: 0.91 mg/dL (ref 0.44–1.00)
GFR calc Af Amer: >60 mL/min (ref >60)
GFR calc non Af Amer: >60 mL/min (ref >60)
Glucose, Bld: 176 mg/dL — ABNORMAL HIGH (ref 70–99)
Potassium: 4.5 mmol/L (ref 3.5–5.1)
Sodium: 135 mmol/L (ref 135–145)
Total Bilirubin: 0.8 mg/dL (ref 0.3–1.2)
Total Protein: 6.1 g/dL — ABNORMAL LOW (ref 6.5–8.1)

## 2020-01-28 LAB — GLUCOSE, CAPILLARY
Glucose-Capillary: 183 mg/dL — ABNORMAL HIGH (ref 70–99)
Glucose-Capillary: 241 mg/dL — ABNORMAL HIGH (ref 70–99)

## 2020-01-28 LAB — CULTURE, BLOOD (ROUTINE X 2)
Culture: NO GROWTH
Special Requests: ADEQUATE

## 2020-01-28 MED ORDER — FAMOTIDINE 20 MG PO TABS: 20.0000 mg | ORAL_TABLET | Freq: Every day | ORAL | 0 refills | Status: DC

## 2020-01-28 MED ORDER — BENZONATATE 100 MG PO CAPS: 100.0000 mg | ORAL_CAPSULE | Freq: Three times a day (TID) | ORAL | 0 refills | Status: DC | PRN

## 2020-01-28 MED ORDER — ASPIRIN EC 325 MG PO TBEC: 325.0000 mg | DELAYED_RELEASE_TABLET | Freq: Every day | ORAL | 0 refills | Status: AC

## 2020-01-28 MED ORDER — FAMOTIDINE 20 MG PO TABS: 20.0000 mg | ORAL_TABLET | Freq: Every day | ORAL | Status: DC

## 2020-01-28 MED ORDER — METFORMIN HCL 500 MG PO TABS: 500.0000 mg | ORAL_TABLET | Freq: Two times a day (BID) | ORAL | 0 refills | Status: DC

## 2020-01-28 MED ORDER — PREDNISONE 20 MG PO TABS: ORAL_TABLET | ORAL | 0 refills | Status: DC

## 2020-01-28 NOTE — Discharge Instructions (Signed)
COVID-19 COVID-19 is a respiratory infection that is caused by a virus called severe acute respiratory syndrome coronavirus 2 (SARS-CoV-2). The disease is also known as coronavirus disease or novel coronavirus. In some people, the virus may not cause any symptoms. In others, it may cause a serious infection. The infection can get worse quickly and can lead to complications, such as:  Pneumonia, or infection of the lungs.  Acute respiratory distress syndrome or ARDS. This is a condition in which fluid build-up in the lungs prevents the lungs from filling with air and passing oxygen into the blood.  Acute respiratory failure. This is a condition in which there is not enough oxygen passing from the lungs to the body or when carbon dioxide is not passing from the lungs out of the body.  Sepsis or septic shock. This is a serious bodily reaction to an infection.  Blood clotting problems.  Secondary infections due to bacteria or fungus.  Organ failure. This is when your body's organs stop working. The virus that causes COVID-19 is contagious. This means that it can spread from person to person through droplets from coughs and sneezes (respiratory secretions). What are the causes? This illness is caused by a virus. You may catch the virus by:  Breathing in droplets from an infected person. Droplets can be spread by a person breathing, speaking, singing, coughing, or sneezing.  Touching something, like a table or a doorknob, that was exposed to the virus (contaminated) and then touching your mouth, nose, or eyes. What increases the risk? Risk for infection You are more likely to be infected with this virus if you:  Are within 6 feet (2 meters) of a person with COVID-19.  Provide care for or live with a person who is infected with COVID-19.  Spend time in crowded indoor spaces or live in shared housing. Risk for serious illness You are more likely to become seriously ill from the virus if you:   Are 50 years of age or older. The higher your age, the more you are at risk for serious illness.  Live in a nursing home or long-term care facility.  Have cancer.  Have a long-term (chronic) disease such as: ? Chronic lung disease, including chronic obstructive pulmonary disease or asthma. ? A long-term disease that lowers your body's ability to fight infection (immunocompromised). ? Heart disease, including heart failure, a condition in which the arteries that lead to the heart become narrow or blocked (coronary artery disease), a disease which makes the heart muscle thick, weak, or stiff (cardiomyopathy). ? Diabetes. ? Chronic kidney disease. ? Sickle cell disease, a condition in which red blood cells have an abnormal "sickle" shape. ? Liver disease.  Are obese. What are the signs or symptoms? Symptoms of this condition can range from mild to severe. Symptoms may appear any time from 2 to 14 days after being exposed to the virus. They include:  A fever or chills.  A cough.  Difficulty breathing.  Headaches, body aches, or muscle aches.  Runny or stuffy (congested) nose.  A sore throat.  New loss of taste or smell. Some people may also have stomach problems, such as nausea, vomiting, or diarrhea. Other people may not have any symptoms of COVID-19. How is this diagnosed? This condition may be diagnosed based on:  Your signs and symptoms, especially if: ? You live in an area with a COVID-19 outbreak. ? You recently traveled to or from an area where the virus is common. ? You   provide care for or live with a person who was diagnosed with COVID-19. ? You were exposed to a person who was diagnosed with COVID-19.  A physical exam.  Lab tests, which may include: ? Taking a sample of fluid from the back of your nose and throat (nasopharyngeal fluid), your nose, or your throat using a swab. ? A sample of mucus from your lungs (sputum). ? Blood tests.  Imaging tests, which  may include, X-rays, CT scan, or ultrasound. How is this treated? At present, there is no medicine to treat COVID-19. Medicines that treat other diseases are being used on a trial basis to see if they are effective against COVID-19. Your health care provider will talk with you about ways to treat your symptoms. For most people, the infection is mild and can be managed at home with rest, fluids, and over-the-counter medicines. Treatment for a serious infection usually takes places in a hospital intensive care unit (ICU). It may include one or more of the following treatments. These treatments are given until your symptoms improve.  Receiving fluids and medicines through an IV.  Supplemental oxygen. Extra oxygen is given through a tube in the nose, a face mask, or a hood.  Positioning you to lie on your stomach (prone position). This makes it easier for oxygen to get into the lungs.  Continuous positive airway pressure (CPAP) or bi-level positive airway pressure (BPAP) machine. This treatment uses mild air pressure to keep the airways open. A tube that is connected to a motor delivers oxygen to the body.  Ventilator. This treatment moves air into and out of the lungs by using a tube that is placed in your windpipe.  Tracheostomy. This is a procedure to create a hole in the neck so that a breathing tube can be inserted.  Extracorporeal membrane oxygenation (ECMO). This procedure gives the lungs a chance to recover by taking over the functions of the heart and lungs. It supplies oxygen to the body and removes carbon dioxide. Follow these instructions at home: Lifestyle  If you are sick, stay home except to get medical care. Your health care provider will tell you how long to stay home. Call your health care provider before you go for medical care.  Rest at home as told by your health care provider.  Do not use any products that contain nicotine or tobacco, such as cigarettes, e-cigarettes, and  chewing tobacco. If you need help quitting, ask your health care provider.  Return to your normal activities as told by your health care provider. Ask your health care provider what activities are safe for you. General instructions  Take over-the-counter and prescription medicines only as told by your health care provider.  Drink enough fluid to keep your urine pale yellow.  Keep all follow-up visits as told by your health care provider. This is important. How is this prevented?  There is no vaccine to help prevent COVID-19 infection. However, there are steps you can take to protect yourself and others from this virus. To protect yourself:   Do not travel to areas where COVID-19 is a risk. The areas where COVID-19 is reported change often. To identify high-risk areas and travel restrictions, check the CDC travel website: wwwnc.cdc.gov/travel/notices  If you live in, or must travel to, an area where COVID-19 is a risk, take precautions to avoid infection. ? Stay away from people who are sick. ? Wash your hands often with soap and water for 20 seconds. If soap and water   are not available, use an alcohol-based hand sanitizer. ? Avoid touching your mouth, face, eyes, or nose. ? Avoid going out in public, follow guidance from your state and local health authorities. ? If you must go out in public, wear a cloth face covering or face mask. Make sure your mask covers your nose and mouth. ? Avoid crowded indoor spaces. Stay at least 6 feet (2 meters) away from others. ? Disinfect objects and surfaces that are frequently touched every day. This may include:  Counters and tables.  Doorknobs and light switches.  Sinks and faucets.  Electronics, such as phones, remote controls, keyboards, computers, and tablets. To protect others: If you have symptoms of COVID-19, take steps to prevent the virus from spreading to others.  If you think you have a COVID-19 infection, contact your health care  provider right away. Tell your health care team that you think you may have a COVID-19 infection.  Stay home. Leave your house only to seek medical care. Do not use public transport.  Do not travel while you are sick.  Wash your hands often with soap and water for 20 seconds. If soap and water are not available, use alcohol-based hand sanitizer.  Stay away from other members of your household. Let healthy household members care for children and pets, if possible. If you have to care for children or pets, wash your hands often and wear a mask. If possible, stay in your own room, separate from others. Use a different bathroom.  Make sure that all people in your household wash their hands well and often.  Cough or sneeze into a tissue or your sleeve or elbow. Do not cough or sneeze into your hand or into the air.  Wear a cloth face covering or face mask. Make sure your mask covers your nose and mouth. Where to find more information  Centers for Disease Control and Prevention: www.cdc.gov/coronavirus/2019-ncov/index.html  World Health Organization: www.who.int/health-topics/coronavirus Contact a health care provider if:  You live in or have traveled to an area where COVID-19 is a risk and you have symptoms of the infection.  You have had contact with someone who has COVID-19 and you have symptoms of the infection. Get help right away if:  You have trouble breathing.  You have pain or pressure in your chest.  You have confusion.  You have bluish lips and fingernails.  You have difficulty waking from sleep.  You have symptoms that get worse. These symptoms may represent a serious problem that is an emergency. Do not wait to see if the symptoms will go away. Get medical help right away. Call your local emergency services (911 in the [place] Do not drive yourself to the hospital. Let the emergency medical personnel know if you think you have COVID-19. Summary  COVID-19 is a  respiratory infection that is caused by a virus. It is also known as coronavirus disease or novel coronavirus. It can cause serious infections, such as pneumonia, acute respiratory distress syndrome, acute respiratory failure, or sepsis.  The virus that causes COVID-19 is contagious. This means that it can spread from person to person through droplets from breathing, speaking, singing, coughing, or sneezing.  You are more likely to develop a serious illness if you are 50 years of age or older, have a weak immune system, live in a nursing home, or have chronic disease.  There is no medicine to treat COVID-19. Your health care provider will talk with you about ways to treat your symptoms.    Take steps to protect yourself and others from infection. Wash your hands often and disinfect objects and surfaces that are frequently touched every day. Stay away from people who are sick and wear a mask if you are sick. This information is not intended to replace advice given to you by your health care provider. Make sure you discuss any questions you have with your health care provider. Document Revised: 02/11/2019 Document Reviewed: 05/20/2018 [employer] Patient Education  2020 [employer]  

## 2020-01-28 NOTE — Progress Notes (Signed)
D/C instruction given to patient. IV d/c'd. Patient d/c home per MD order.

## 2020-01-28 NOTE — Discharge Summary (Signed)
[facility]  Physician Discharge Summary   Patient ID: Mckenzie Miller MRN: [id number] DOB/AGE: 1985/05/06 34 y.o.  Admit date: 01/22/2020 Discharge date: 01/28/2020  PCP: Patient, No Pcp Per  DISCHARGE DIAGNOSES:  Pneumonia due to COVID-19 Acute respiratory failure with hypoxia, resolved Transaminitis, improving Normocytic anemia Diabetes mellitus type 2 in obese, newly diagnosed Morbid obesity  RECOMMENDATIONS FOR OUTPATIENT FOLLOW UP: 1. Outpatient follow-up with PCP for further management of diabetes    Home Health: None Equipment/Devices: None  CODE STATUS: Full code  DISCHARGE CONDITION: fair  Diet recommendation: Modified carbohydrate  INITIAL HISTORY: Patient is a 34 y.o. female with PMHx of obesity-diagnosed with COVID-19 on September 20-presented with worsening shortness of breath-found to have acute hypoxic respiratory failure due to COVID-19 pneumonia.   HOSPITAL COURSE:   Acute Hypoxic Resp. Failure/Pneumonia due to COVID-19 Patient was started on Remdesivir steroids.  She was initially requiring about 5 L of oxygen.  She slowly started feeling better.  She has completed course of Remdesivir.  Her requirements had come down to 2 L yesterday.  Today she was taken off of oxygen and ambulated and did not drop below 92%.  She continues to have a cough and some exertional dyspnea but otherwise is doing well.  CRP is now normal.  D-dimer is normal.  We will give her tapering doses of prednisone.  Otherwise she is stable for discharge.  She was also given furosemide.  Also underwent echocardiogram which showed EF 65 to 70%.  No wall motion abnormalities.  Right ventricular systolic function was normal.  No significant valvular disease.  Transaminitis Secondary to COVID-19.  Seems to be improving.  Normocytic anemia Stable.  Hypokalemia  Diabetes mellitus type 2, in obese, newly diagnosed  Some element of steroid-induced hyperglycemia is also  present.  HbA1c however was 6.4.  She will be discharged on Metformin.  Recommend outpatient follow-up with PCP.  Morbid Obesity: Estimated body mass index is 47.19 kg/m as calculated from the following:   Height as of this encounter:  (1.651 m).   Weight as of this encounter: 128.6 kg.    Overall stable.  Okay for discharge home today.   PERTINENT LABS:  The results of significant diagnostics from this hospitalization (including imaging, microbiology, ancillary and laboratory) are listed below for reference.    Microbiology: Recent Results (from the past 240 hour(s))  SARS Coronavirus 2 by RT PCR (hospital order, performed in [facility] hospital lab) Nasopharyngeal Nasopharyngeal Swab     Status: None   Collection Time: 01/22/20  8:05 PM   Specimen: Nasopharyngeal Swab  Result Value Ref Range Status   SARS Coronavirus 2 NEGATIVE NEGATIVE Final    Comment: (NOTE) SARS-CoV-2 target nucleic acids are NOT DETECTED.  The SARS-CoV-2 RNA is generally detectable in upper and lower respiratory specimens during the acute phase of infection. The lowest concentration of SARS-CoV-2 viral copies this assay can detect is 250 copies / mL. A negative result does not preclude SARS-CoV-2 infection and should not be used as the sole basis for treatment or other patient management decisions.  A negative result may occur with improper specimen collection / handling, submission of specimen other than nasopharyngeal swab, presence of viral mutation(s) within the areas targeted by this assay, and inadequate number of viral copies (<250 copies / mL). A negative result must be combined with clinical observations, patient history, and epidemiological information.  Fact Sheet for Patients:   BoilerBrush.com.cy  Fact Sheet for Healthcare Providers: https://pope.com/  This  test is not yet approved or  cleared by the [place] and has been  authorized for detection and/or diagnosis of SARS-CoV-2 by FDA under an Emergency Use Authorization (EUA).  This EUA will remain in effect (meaning this test can be used) for the duration of the COVID-19 declaration under Section 564(b)(1) of the Act, 21 U.S.C. section 360bbb-3(b)(1), unless the authorization is terminated or revoked sooner.  Performed at [facility], [address], [place], [place] [address]   Blood Culture (routine x 2)     Status: None   Collection Time: 01/22/20  8:53 PM   Specimen: BLOOD  Result Value Ref Range Status   Specimen Description BLOOD RIGHT ANTECUBITAL  Final   Special Requests   Final    BOTTLES DRAWN AEROBIC AND ANAEROBIC Blood Culture adequate volume   Culture   Final    NO GROWTH 5 DAYS Performed at [facility], [address], [place], [place] [address]    Report Status 01/27/2020 FINAL  Final  Blood Culture (routine x 2)     Status: None   Collection Time: 01/23/20  7:43 AM   Specimen: BLOOD  Result Value Ref Range Status   Specimen Description BLOOD LEFT ANTECUBITAL  Final   Special Requests   Final    BOTTLES DRAWN AEROBIC AND ANAEROBIC Blood Culture adequate volume   Culture   Final    NO GROWTH 5 DAYS Performed at [facility], [address] [place], [place] [address]    Report Status 01/28/2020 FINAL  Final  Respiratory Panel by RT PCR (Flu A&B, Covid) - Nasopharyngeal Swab     Status: Abnormal   Collection Time: 01/23/20  8:40 AM   Specimen: Nasopharyngeal Swab  Result Value Ref Range Status   SARS Coronavirus 2 by RT PCR POSITIVE (A) NEGATIVE Final    Comment: emailed L. Berdik RN 10:20 01/23/20 ([email or web address]) (NOTE) SARS-CoV-2 target nucleic acids are DETECTED.  SARS-CoV-2 RNA is generally detectable in upper respiratory specimens  during the acute phase of infection. Positive results are indicative of the presence of the identified virus, but do not rule out bacterial infection or co-infection with other  pathogens not detected by the test. Clinical correlation with patient history and other diagnostic information is necessary to determine patient infection status. The expected result is Negative.  Fact Sheet for Patients:  https://www.moore.com/  Fact Sheet for Healthcare Providers: https://www.young.biz/  This test is not yet approved or cleared by the [place] FDA and  has been authorized for detection and/or diagnosis of SARS-CoV-2 by FDA under an Emergency Use Authorization (EUA).  This EUA will remain in effect (meaning this test can be used) for the duration of  the COVID-19  declaration under Section 564(b)(1) of the Act, 21 U.S.C. section 360bbb-3(b)(1), unless the authorization is terminated or revoked sooner.      Influenza A by PCR NEGATIVE NEGATIVE Final   Influenza B by PCR NEGATIVE NEGATIVE Final    Comment: (NOTE) The Xpert Xpress SARS-CoV-2/FLU/RSV assay is intended as an aid in  the diagnosis of influenza from Nasopharyngeal swab specimens and  should not be used as a sole basis for treatment. Nasal washings and  aspirates are unacceptable for Xpert Xpress SARS-CoV-2/FLU/RSV  testing.  Fact Sheet for Patients: https://www.moore.com/  Fact Sheet for Healthcare Providers: https://www.young.biz/  This test is not yet approved or cleared by the [place] FDA and  has been authorized for detection and/or diagnosis of  SARS-CoV-2 by  FDA under an Emergency Use Authorization (EUA). This EUA will remain  in effect (meaning this test can be used) for the duration of the  Covid-19 declaration under Section 564(b)(1) of the Act, 21  U.S.C. section 360bbb-3(b)(1), unless the authorization is  terminated or revoked. Performed at [facility], [address] [place], [place] [address]      Labs:  COVID-19 Labs  Recent Labs    01/26/20 4196 01/27/20 0635  DDIMER 0.35  0.46  CRP 0.7 0.7    Lab Results  Component Value Date   SARSCOV2NAA POSITIVE (A) 01/23/2020   SARSCOV2NAA NEGATIVE 01/22/2020      Basic Metabolic Panel: Recent Labs  Lab 01/24/20 0143 01/25/20 0841 01/26/20 0954 01/27/20 0635 01/28/20 0350  NA 135 135 137 139 135  K 3.6 4.0 3.4* 3.9 4.5  CL 99 97* 101 100 100  CO2 25 24 26 27 25   GLUCOSE 342* 288* 200* 153* 176*  BUN 9 17 18 16 18   CREATININE 0.90 0.91 0.88 0.92 0.91  CALCIUM 8.8* 8.9 9.0 9.1 8.9   Liver Function Tests: Recent Labs  Lab 01/24/20 0143 01/25/20 0841 01/26/20 0954 01/27/20 0635 01/28/20 0350  AST 75* 41 39 31 23  ALT 170* 127* 104* 85* 72*  ALKPHOS 75 69 70 63 60  BILITOT 0.7 0.6 0.6 0.7 0.8  PROT 6.8 6.8 6.7 6.2* 6.1*  ALBUMIN 2.6* 2.7* 2.7* 2.6* 2.6*   CBC: Recent Labs  Lab 01/24/20 0143 01/25/20 0841 01/26/20 0954 01/27/20 0635 01/28/20 0350  WBC 13.3* 13.3* 8.4 9.3 10.9*  NEUTROABS 11.8* 12.0* 6.7 7.2 8.1*  HGB 11.6* 11.9* 12.0 11.8* 12.0  HCT 35.5* 36.8 37.7 36.6 36.8  MCV 88.8 89.3 90.0 89.7 89.5  PLT 310 409* 390 466* 426*    CBG: Recent Labs  Lab 01/24/20 1159 01/24/20 1633 01/25/20 0723 01/28/20 0746 01/28/20 1149  GLUCAP 305* 261* 234* 183* 241*     IMAGING STUDIES DG Chest Port 1 View  Result Date: 01/27/2020 CLINICAL DATA:  Shortness of breath.  COVID-19 positive EXAM: PORTABLE CHEST 1 VIEW COMPARISON:  January 22, 2020 FINDINGS: There is patchy airspace opacity in mid and lower lung regions, similar to prior study. No new opacity evident. Heart is upper normal in size with pulmonary vascularity within normal limits. No adenopathy. No bone lesions. IMPRESSION: Patchy airspace opacity in the mid and lower lung regions bilaterally is essentially stable, consistent with atypical organism pneumonia. No new opacity evident. Stable cardiac silhouette. Electronically Signed   By: 03/28/2020 III M.D.   On: 01/27/2020 07:57   DG Chest Portable 1 View  Result  Date: 01/22/2020 CLINICAL DATA:  Cough and shortness of breath. COVID positive yesterday. EXAM: PORTABLE CHEST 1 VIEW COMPARISON:  Chest radiograph 08/25/2014 FINDINGS: Patchy heterogeneous bilateral airspace opacities in a mid-lower lung zone predominant distribution, moderate parenchymal involvement. The heart is normal in size. Normal mediastinal contours. No pneumothorax or evidence of pneumomediastinum. No significant pleural effusion. No acute osseous abnormalities are seen. IMPRESSION: Bilateral patchy heterogeneous airspace opacities consistent with multifocal pneumonia, pattern typical of COVID-19. Electronically Signed   By: 01/24/2020 M.D.   On: 01/22/2020 16:21   ECHOCARDIOGRAM LIMITED  Result Date: 01/26/2020    ECHOCARDIOGRAM LIMITED REPORT   Patient Name:   Mckenzie Miller [facility] Date of Exam: 01/26/2020 Medical Rec #:  [facility]         Height:       65.0 in Accession #:  [id number]        Weight:       283.6 lb Date of Birth:  1985-08-28          BSA:          2.295 m Patient Age:    34 years          BP:           101/61 mmHg Patient Gender: F                 HR:           60 bpm. Exam Location:  Inpatient Procedure: Limited Color Doppler, Cardiac Doppler and Limited Echo Indications:    acute respiratory failure 518.82  History:        Patient has no prior history of Echocardiogram examinations.                 Covid; Signs/Symptoms:Shortness of Breath.  Sonographer:    Delcie Roch Referring Phys: [id number] Dewayne Shorter M GHIMIRE IMPRESSIONS  1. Left ventricular ejection fraction, by estimation, is 65 to 70%. The left ventricle has normal function. The left ventricle has no regional wall motion abnormalities. Left ventricular diastolic parameters were normal.  2. Right ventricular systolic function is normal. The right ventricular size is normal.  3. The mitral valve is normal in structure. Trivial mitral valve regurgitation.  4. The aortic valve is normal in structure. Aortic valve regurgitation  is not visualized.  5. The inferior vena cava is dilated in size with >50% respiratory variability, suggesting right atrial pressure of 8 mmHg. Comparison(s): No prior Echocardiogram. FINDINGS  Left Ventricle: Left ventricular ejection fraction, by estimation, is 65 to 70%. The left ventricle has normal function. The left ventricle has no regional wall motion abnormalities. The left ventricular internal cavity size was normal in size. There is  no left ventricular hypertrophy. Left ventricular diastolic parameters were normal. Right Ventricle: The right ventricular size is normal. No increase in right ventricular wall thickness. Right ventricular systolic function is normal. Left Atrium: Left atrial size was normal in size. Right Atrium: Right atrial size was normal in size. Pericardium: There is no evidence of pericardial effusion. Mitral Valve: The mitral valve is normal in structure. Trivial mitral valve regurgitation. Tricuspid Valve: The tricuspid valve is normal in structure. Tricuspid valve regurgitation is trivial. Aortic Valve: The aortic valve is normal in structure. Aortic valve regurgitation is not visualized. Pulmonic Valve: The pulmonic valve was normal in structure. Pulmonic valve regurgitation is trivial. Venous: The inferior vena cava is dilated in size with greater than 50% respiratory variability, suggesting right atrial pressure of 8 mmHg. IAS/Shunts: No atrial level shunt detected by color flow Doppler. LEFT VENTRICLE PLAX 2D LVIDd:         4.50 cm Diastology LVIDs:         2.80 cm LV e' medial:    10.90 cm/s LV PW:         0.80 cm LV E/e' medial:  8.2 LV IVS:        0.80 cm LV e' lateral:   14.30 cm/s                        LV E/e' lateral: 6.3  IVC IVC diam: 2.30 cm LEFT ATRIUM         Index LA diam:    3.40 cm 1.48 cm/m  AORTIC VALVE LVOT Vmax:   145.00 cm/s LVOT Vmean:  93.200  cm/s LVOT VTI:    0.361 m  AORTA Ao Root diam: 3.00 cm MITRAL VALVE MV Area (PHT): 2.18 cm    SHUNTS MV Decel  Time: 348 msec    Systemic VTI: 0.36 m MV E velocity: 89.50 cm/s MV A velocity: 72.40 cm/s MV E/A ratio:  1.24 Laurance Flatten MD Electronically signed by Laurance Flatten MD Signature Date/Time: 01/26/2020/6:18:55 PM    Final     DISCHARGE EXAMINATION: Vitals:   01/28/20 0041 01/28/20 0052 01/28/20 0351 01/28/20 0747  BP:  121/76 106/74 110/73  Pulse:  (!) 54 (!) 59 62  Resp:  20 18 17   Temp: (!) 97.5 F (36.4 C)  97.6 F (36.4 C) 98.4 F (36.9 C)  TempSrc: Oral  Oral Oral  SpO2:  95% 96% 97%  Weight:      Height:       General appearance: Awake alert.  In no distress Resp: Improved effort.  Few crackles at the bases.  No wheezing or rhonchi. Cardio: S1-S2 is normal regular.  No S3-S4.  No rubs murmurs or bruit GI: Abdomen is soft.  Nontender nondistended.  Bowel sounds are present normal.  No masses organomegaly     DISPOSITION: Home  Discharge Instructions    Call MD for:  difficulty breathing, headache or visual disturbances   Complete by: As directed    Call MD for:  extreme fatigue   Complete by: As directed    Call MD for:  persistant dizziness or light-headedness   Complete by: As directed    Call MD for:  persistant nausea and vomiting   Complete by: As directed    Call MD for:  severe uncontrolled pain   Complete by: As directed    Call MD for:  temperature >100.4   Complete by: As directed    Diet - low sodium heart healthy   Complete by: As directed    Diet Carb Modified   Complete by: As directed    Discharge instructions   Complete by: As directed    Please be sure to follow-up with outpatient providers.  Your glucose levels have been noted to be high during the hospital stay which could be due to the steroids that you have been receiving.  But you could also have early diabetes.  Please discuss this further with your primary care provider.  Your oxygen saturations did not drop below 90% when you were walked.  So you do not need home oxygen at this  time.  You were cared for by a hospitalist during your hospital stay. If you have any questions about your discharge medications or the care you received while you were in the hospital after you are discharged, you can call the unit and asked to speak with the hospitalist on call if the hospitalist that took care of you is not available. Once you are discharged, your primary care physician will handle any further medical issues. Please note that NO REFILLS for any discharge medications will be authorized once you are discharged, as it is imperative that you return to your primary care physician (or establish a relationship with a primary care physician if you do not have one) for your aftercare needs so that they can reassess your need for medications and monitor your lab values. If you do not have a primary care physician, you can call [phone] for a physician referral.   Increase activity slowly   Complete by: As directed  Allergies as of 01/28/2020      Reactions   Sulfa Antibiotics Hives   Sulfites Hives   Unsure which one, allergic       Medication List    STOP taking these medications   azithromycin 250 MG tablet Commonly known as: Zithromax Z-Pak   HYDROcodone-homatropine 5-1.5 MG/5ML syrup Commonly known as: HYCODAN   oxyCODONE 5 MG immediate release tablet Commonly known as: Roxicodone     TAKE these medications   acetaminophen 325 MG tablet Commonly known as: TYLENOL Take 650 mg by mouth every 6 (six) hours as needed for mild pain or headache.   aspirin EC 325 MG tablet Take 1 tablet (325 mg total) by mouth daily for 14 days.   benzonatate 100 MG capsule Commonly known as: Tessalon Perles Take 1 capsule (100 mg total) by mouth 3 (three) times daily as needed for cough.   famotidine 20 MG tablet Commonly known as: PEPCID Take 1 tablet (20 mg total) by mouth daily for 14 days.   metFORMIN 500 MG tablet Commonly known as: GLUCOPHAGE Take 1 tablet (500 mg  total) by mouth 2 (two) times daily with a meal.   predniSONE 20 MG tablet Commonly known as: DELTASONE Take 3 tablets once daily for 3 days followed by 2 tablets once daily for 3 days followed by 1 tablet once daily for 3 days and then stop What changed:   how much to take  how to take this  when to take this  additional instructions   ROBITUSSIN 12 HOUR COUGH PO Take 20 mLs by mouth 2 (two) times daily as needed (for cough).   vitamin C 1000 MG tablet Take 1,000 mg by mouth daily.   zinc gluconate 50 MG tablet Take 50 mg by mouth daily.         Follow-up Information    Post-COVID Care Clinic at [facility]. Go on 02/06/2020.   Specialty: Family Medicine Why: Appointment made for 02/06/20 @ 2:45 pm.  MUST ATTEND THIS APPOINTMENT. Contact information: [address] [place] [phone]       [facility] Follow up.   Why: This appointment will be made by COVID clinic to establish a PCP Contact information: [address] [place] [place] [facility] [place] [place]  [phone]              TOTAL DISCHARGE TIME: 35 minutes  Trasean Delima Rito EhrlichKrishnan  [facility] Pager on [email or web address]  01/28/2020, 3:11 PM

## 2020-01-28 NOTE — Progress Notes (Signed)
SATURATION QUALIFICATIONS: (This note is used to comply with regulatory documentation for home oxygen)  Patient Saturations on Room Air at Rest = 97%  Patient Saturations on Room Air while Ambulating = 92%  Patient Saturations on 2 Liters of oxygen while Ambulating = 98%  Please briefly explain why patient needs home oxygen: Dyspnea upon exertion noted.

## 2020-02-02 ENCOUNTER — Other Ambulatory Visit: Payer: Medicaid Other

## 2020-02-02 DIAGNOSIS — Z20822 Contact with and (suspected) exposure to covid-19: Secondary | ICD-10-CM

## 2020-02-03 LAB — SARS-COV-2, NAA 2 DAY TAT

## 2020-02-03 LAB — NOVEL CORONAVIRUS, NAA: SARS-CoV-2, NAA: NOT DETECTED

## 2020-02-06 ENCOUNTER — Ambulatory Visit (INDEPENDENT_AMBULATORY_CARE_PROVIDER_SITE_OTHER): Payer: HRSA Program | Admitting: Nurse Practitioner

## 2020-02-06 VITALS — BP 118/84 | HR 88 | Temp 97.5°F | Wt 281.0 lb

## 2020-02-06 DIAGNOSIS — J1282 Pneumonia due to coronavirus disease 2019: Secondary | ICD-10-CM | POA: Diagnosis not present

## 2020-02-06 DIAGNOSIS — U071 COVID-19: Secondary | ICD-10-CM | POA: Diagnosis not present

## 2020-02-06 DIAGNOSIS — R7303 Prediabetes: Secondary | ICD-10-CM | POA: Diagnosis not present

## 2020-02-06 NOTE — Progress Notes (Signed)
@Patient  ID: , female    DOB: 04/21/1986, 34 y.o.   MRN: [id number]  Chief Complaint  Patient presents with  . Hospitalization Follow-up    COVID Pos: 9/27 Sx: Cough recieved Remdesivir in hosp.     Referring provider: No ref. provider found   34 year old female with no significant health history.  Diagnosed with Covid on 01/23/2020.  HPI  Patient presents today for post COVID care clinic visit.  Patient was hospitalized from 01/22/2020 through 12/29/2019.  She was diagnosed with Covid pneumonia, acute respiratory failure with hypoxia, transaminitis, and newly diagnosed diabetes.  Patient was treated with remdesivir, steroids, oxygen.  Patient did have echo and the hospital which was overall normal.  Patient was newly diagnosed with diabetes and states that she has been taking her Metformin as directed at hospital discharge, but has not been checking her blood sugars.  Patient does need a primary care physician to follow her for diabetes.  We will set this up during the visit today.  Overall patient states that she is doing well.  We will check follow-up lab work today.  Patient wanting to return in 3 weeks for follow-up chest x-ray. Denies f/c/s, n/v/d, hemoptysis, PND, chest pain or edema.      Allergies  Allergen Reactions  . Sulfa Antibiotics Hives  . Sulfites Hives    Unsure which one, allergic      There is no immunization history on file for this patient.  No past medical history on file.  Tobacco History: Social History   Tobacco Use  Smoking Status Current Every Day Smoker  . Packs/day: 0.25  . Types: Cigarettes  Smokeless Tobacco Never Used   Ready to quit: Not Answered Counseling given: Not Answered   Outpatient Encounter Medications as of 02/06/2020  Medication Sig  . Ascorbic Acid (VITAMIN C) 1000 MG tablet Take 1,000 mg by mouth daily.  . benzonatate (TESSALON PERLES) 100 MG capsule Take 1 capsule (100 mg total) by mouth 3 (three) times  daily as needed for cough.  . metFORMIN (GLUCOPHAGE) 500 MG tablet Take 1 tablet (500 mg total) by mouth 2 (two) times daily with a meal.  . zinc gluconate 50 MG tablet Take 50 mg by mouth daily.  04/07/2020 acetaminophen (TYLENOL) 325 MG tablet Take 650 mg by mouth every 6 (six) hours as needed for mild pain or headache. (Patient not taking: Reported on 02/06/2020)  . aspirin EC 325 MG tablet Take 1 tablet (325 mg total) by mouth daily for 14 days. (Patient not taking: Reported on 02/06/2020)  . Dextromethorphan Polistirex (ROBITUSSIN 12 HOUR COUGH PO) Take 20 mLs by mouth 2 (two) times daily as needed (for cough). (Patient not taking: Reported on 02/06/2020)  . famotidine (PEPCID) 20 MG tablet Take 1 tablet (20 mg total) by mouth daily for 14 days. (Patient not taking: Reported on 02/06/2020)  . predniSONE (DELTASONE) 20 MG tablet Take 3 tablets once daily for 3 days followed by 2 tablets once daily for 3 days followed by 1 tablet once daily for 3 days and then stop (Patient not taking: Reported on 02/06/2020)   No facility-administered encounter medications on file as of 02/06/2020.     Review of Systems  Review of Systems  Constitutional: Negative for fatigue and fever.  HENT: Negative.   Respiratory: Negative for cough and shortness of breath.   Cardiovascular: Negative.  Negative for chest pain, palpitations and leg swelling.  Gastrointestinal: Negative.   Allergic/Immunologic: Negative.   Neurological:  Negative.   Psychiatric/Behavioral: Negative.        Physical Exam  BP 118/84 (BP Location: Right Arm)   Pulse 88   Temp (!) 97.5 F (36.4 C)   Wt 281 lb 0.1 oz (127.5 kg)   LMP 01/17/2020   SpO2 96%   BMI 46.76 kg/m   Wt Readings from Last 5 Encounters:  02/06/20 281 lb 0.1 oz (127.5 kg)  01/23/20 283 lb 9.6 oz (128.6 kg)  12/24/16 (!) 335 lb 9 oz (152.2 kg)  08/25/14 (!) 329 lb (149.2 kg)     Physical Exam Vitals and nursing note reviewed.  Constitutional:       General: She is not in acute distress.    Appearance: She is well-developed.  Cardiovascular:     Rate and Rhythm: Normal rate and regular rhythm.  Pulmonary:     Effort: Pulmonary effort is normal.     Breath sounds: Normal breath sounds.  Musculoskeletal:     Right lower leg: No edema.     Left lower leg: No edema.  Neurological:     Mental Status: She is alert and oriented to person, place, and time.  Psychiatric:        Mood and Affect: Mood normal.        Behavior: Behavior normal.      Imaging: DG Chest Port 1 View  Result Date: 01/27/2020 CLINICAL DATA:  Shortness of breath.  COVID-19 positive EXAM: PORTABLE CHEST 1 VIEW COMPARISON:  January 22, 2020 FINDINGS: There is patchy airspace opacity in mid and lower lung regions, similar to prior study. No new opacity evident. Heart is upper normal in size with pulmonary vascularity within normal limits. No adenopathy. No bone lesions. IMPRESSION: Patchy airspace opacity in the mid and lower lung regions bilaterally is essentially stable, consistent with atypical organism pneumonia. No new opacity evident. Stable cardiac silhouette. Electronically Signed   By: Bretta Bang III M.D.   On: 01/27/2020 07:57   DG Chest Portable 1 View  Result Date: 01/22/2020 CLINICAL DATA:  Cough and shortness of breath. COVID positive yesterday. EXAM: PORTABLE CHEST 1 VIEW COMPARISON:  Chest radiograph 08/25/2014 FINDINGS: Patchy heterogeneous bilateral airspace opacities in a mid-lower lung zone predominant distribution, moderate parenchymal involvement. The heart is normal in size. Normal mediastinal contours. No pneumothorax or evidence of pneumomediastinum. No significant pleural effusion. No acute osseous abnormalities are seen. IMPRESSION: Bilateral patchy heterogeneous airspace opacities consistent with multifocal pneumonia, pattern typical of COVID-19. Electronically Signed   By: Narda Rutherford M.D.   On: 01/22/2020 16:21   ECHOCARDIOGRAM  LIMITED  Result Date: 01/26/2020    ECHOCARDIOGRAM LIMITED REPORT   Patient Name:   Mckenzie Miller [facility] Date of Exam: 01/26/2020 Medical Rec #:  [id number]         Height:       65.0 in Accession #:    [id number]        Weight:       283.6 lb Date of Birth:  02-Feb-1986          BSA:          2.295 m Patient Age:    34 years          BP:           101/61 mmHg Patient Gender: F                 HR:           60 bpm. Exam Location:  Inpatient Procedure: Limited Color Doppler, Cardiac Doppler and Limited Echo Indications:    acute respiratory failure 518.82  History:        Patient has no prior history of Echocardiogram examinations.                 Covid; Signs/Symptoms:Shortness of Breath.  Sonographer:    Delcie RochLauren Pennington Referring Phys: [id number] Dewayne ShorterSHANKER M GHIMIRE IMPRESSIONS  1. Left ventricular ejection fraction, by estimation, is 65 to 70%. The left ventricle has normal function. The left ventricle has no regional wall motion abnormalities. Left ventricular diastolic parameters were normal.  2. Right ventricular systolic function is normal. The right ventricular size is normal.  3. The mitral valve is normal in structure. Trivial mitral valve regurgitation.  4. The aortic valve is normal in structure. Aortic valve regurgitation is not visualized.  5. The inferior vena cava is dilated in size with >50% respiratory variability, suggesting right atrial pressure of 8 mmHg. Comparison(s): No prior Echocardiogram. FINDINGS  Left Ventricle: Left ventricular ejection fraction, by estimation, is 65 to 70%. The left ventricle has normal function. The left ventricle has no regional wall motion abnormalities. The left ventricular internal cavity size was normal in size. There is  no left ventricular hypertrophy. Left ventricular diastolic parameters were normal. Right Ventricle: The right ventricular size is normal. No increase in right ventricular wall thickness. Right ventricular systolic function is normal. Left Atrium: Left  atrial size was normal in size. Right Atrium: Right atrial size was normal in size. Pericardium: There is no evidence of pericardial effusion. Mitral Valve: The mitral valve is normal in structure. Trivial mitral valve regurgitation. Tricuspid Valve: The tricuspid valve is normal in structure. Tricuspid valve regurgitation is trivial. Aortic Valve: The aortic valve is normal in structure. Aortic valve regurgitation is not visualized. Pulmonic Valve: The pulmonic valve was normal in structure. Pulmonic valve regurgitation is trivial. Venous: The inferior vena cava is dilated in size with greater than 50% respiratory variability, suggesting right atrial pressure of 8 mmHg. IAS/Shunts: No atrial level shunt detected by color flow Doppler. LEFT VENTRICLE PLAX 2D LVIDd:         4.50 cm Diastology LVIDs:         2.80 cm LV e' medial:    10.90 cm/s LV PW:         0.80 cm LV E/e' medial:  8.2 LV IVS:        0.80 cm LV e' lateral:   14.30 cm/s                        LV E/e' lateral: 6.3  IVC IVC diam: 2.30 cm LEFT ATRIUM         Index LA diam:    3.40 cm 1.48 cm/m  AORTIC VALVE LVOT Vmax:   145.00 cm/s LVOT Vmean:  93.200 cm/s LVOT VTI:    0.361 m  AORTA Ao Root diam: 3.00 cm MITRAL VALVE MV Area (PHT): 2.18 cm    SHUNTS MV Decel Time: 348 msec    Systemic VTI: 0.36 m MV E velocity: 89.50 cm/s MV A velocity: 72.40 cm/s MV E/A ratio:  1.24 Laurance FlattenHeather Pemberton MD Electronically signed by Laurance FlattenHeather Pemberton MD Signature Date/Time: 01/26/2020/6:18:55 PM    Final      Assessment & Plan:   Pneumonia due to COVID-19 virus Cough:   Stay well hydrated  Stay active  Deep breathing exercises  May start vitamin C 2,000 mg daily, vitamin  D3 2,000 IU daily, Zinc 220 mg daily, and Quercetin 500 mg twice daily  May take tylenol or fever or pain  May take mucinex DM twice daily  Will order labs    Follow up:  Follow up in 3 weeks or sooner if needed - for follow up chest xray      Ivonne Andrew,  NP 02/07/2020

## 2020-02-06 NOTE — Patient Instructions (Addendum)
Covid 19 Cough:   Stay well hydrated  Stay active  Deep breathing exercises  May start vitamin C 2,000 mg daily, vitamin D3 2,000 IU daily, Zinc 220 mg daily, and Quercetin 500 mg twice daily  May take tylenol or fever or pain  May take mucinex DM twice daily  Will order labs    Follow up:  Follow up in 3 weeks or sooner if needed - for follow up chest xray

## 2020-02-07 LAB — CBC
Hematocrit: 38.2 % (ref 34.0–46.6)
Hemoglobin: 12.8 g/dL (ref 11.1–15.9)
MCH: 29.8 pg (ref 26.6–33.0)
MCHC: 33.5 g/dL (ref 31.5–35.7)
MCV: 89 fL (ref 79–97)
Platelets: 221 10*3/uL (ref 150–450)
RBC: 4.3 x10E6/uL (ref 3.77–5.28)
RDW: 13.8 % (ref 11.7–15.4)
WBC: 5.8 10*3/uL (ref 3.4–10.8)

## 2020-02-07 LAB — COMPREHENSIVE METABOLIC PANEL
ALT: 36 IU/L — ABNORMAL HIGH (ref 0–32)
AST: 30 IU/L (ref 0–40)
Albumin/Globulin Ratio: 1.1 — ABNORMAL LOW (ref 1.2–2.2)
Albumin: 3.6 g/dL — ABNORMAL LOW (ref 3.8–4.8)
Alkaline Phosphatase: 68 IU/L (ref 44–121)
BUN/Creatinine Ratio: 7 — ABNORMAL LOW (ref 9–23)
BUN: 7 mg/dL (ref 6–20)
Bilirubin Total: 0.5 mg/dL (ref 0.0–1.2)
CO2: 22 mmol/L (ref 20–29)
Calcium: 9.4 mg/dL (ref 8.7–10.2)
Chloride: 107 mmol/L — ABNORMAL HIGH (ref 96–106)
Creatinine, Ser: 0.99 mg/dL (ref 0.57–1.00)
GFR calc Af Amer: 87 mL/min/{1.73_m2} (ref >59)
GFR calc non Af Amer: 75 mL/min/{1.73_m2} (ref >59)
Globulin, Total: 3.3 g/dL (ref 1.5–4.5)
Glucose: 88 mg/dL (ref 65–99)
Potassium: 5.3 mmol/L — ABNORMAL HIGH (ref 3.5–5.2)
Sodium: 143 mmol/L (ref 134–144)
Total Protein: 6.9 g/dL (ref 6.0–8.5)

## 2020-02-07 NOTE — Assessment & Plan Note (Signed)
Cough:   Stay well hydrated  Stay active  Deep breathing exercises  May start vitamin C 2,000 mg daily, vitamin D3 2,000 IU daily, Zinc 220 mg daily, and Quercetin 500 mg twice daily  May take tylenol or fever or pain  May take mucinex DM twice daily  Will order labs    Follow up:  Follow up in 3 weeks or sooner if needed - for follow up chest xray

## 2020-02-08 LAB — SPECIMEN STATUS REPORT

## 2020-02-08 LAB — NOVEL CORONAVIRUS, NAA: SARS-CoV-2, NAA: NOT DETECTED

## 2020-02-08 LAB — SARS-COV-2, NAA 2 DAY TAT

## 2020-02-10 ENCOUNTER — Other Ambulatory Visit: Payer: Self-pay

## 2020-02-10 ENCOUNTER — Ambulatory Visit (INDEPENDENT_AMBULATORY_CARE_PROVIDER_SITE_OTHER): Payer: Self-pay | Admitting: Nurse Practitioner

## 2020-02-10 ENCOUNTER — Encounter: Payer: Self-pay | Admitting: Nurse Practitioner

## 2020-02-10 VITALS — BP 118/69 | HR 74 | Temp 98.1°F | Ht 65.0 in | Wt 282.0 lb

## 2020-02-10 DIAGNOSIS — R7303 Prediabetes: Secondary | ICD-10-CM

## 2020-02-10 DIAGNOSIS — Z6841 Body Mass Index (BMI) 40.0 and over, adult: Secondary | ICD-10-CM

## 2020-02-10 DIAGNOSIS — Z8616 Personal history of COVID-19: Secondary | ICD-10-CM

## 2020-02-10 MED ORDER — TRUE METRIX PRO BLOOD GLUCOSE VI STRP: ORAL_STRIP | 12 refills | Status: DC

## 2020-02-10 MED ORDER — TRUE METRIX METER W/DEVICE KIT: 1.0000 | PACK | Freq: Two times a day (BID) | 0 refills | Status: DC

## 2020-02-10 NOTE — Progress Notes (Signed)
[address] [place], [place]  [address] Phone:  [phone]   Fax:  [phone]   New Patient Office Visit  Subjective:  Patient ID: Mckenzie Miller, female    DOB: Dec 29, 1985  Age: 34 y.o. MRN: [id number]  CC:  Chief Complaint  Patient presents with  . Establish Care    seen in post covid on friday 02/03/2020.       HPI Mckenzie Miller presents for establish care. She  has no past medical history on file.   Patient is in today for hospital follow-up. Recently admitted for Acute respiratory failure and pneumonia due to COVID-19. Hospital course of treatment was from 01/22/2020 to 01/28/2020.  During hospital course of Remdesivir.  She was diagnosed with diabetes mellitus type 2 in obese.  Her hemoglobin A1c was 6.4%.  She was discharged on Metformin 500 mg twice daily.  She is not monitoring her blood glucose.  She does have a freestyle libre sensor on her left arm.  She admits that she has not used this because she has no instructions on how.  She has been tolerating the Metformin without difficulty.   Diabetes Mellitus Patient presents with new onset of Type 2 diabetes. Current symptoms include: none. Patient denies foot ulcerations, hypoglycemia , increased appetite, nausea, paresthesia of the feet, polydipsia, polyuria, visual disturbances and vomiting. Evaluation to date has included: fasting blood sugar and hemoglobin A1C.  Home sugars: patient does not check sugars. Current treatment: Continued metformin which has been unable to assess effectiveness.   History reviewed. No pertinent past medical history.  Past Surgical History:  Procedure Laterality Date  . KNEE SURGERY      Family History  Problem Relation Age of Onset  . Diabetes Mellitus II Mother     Social History   Socioeconomic History  . Marital status: Single    Spouse name: Not on file  . Number of children: Not on file  . Years of education: Not on file  . Highest  education level: Not on file  Occupational History  . Not on file  Tobacco Use  . Smoking status: Current Every Day Smoker    Packs/day: 0.25    Types: Cigarettes  . Smokeless tobacco: Never Used  Substance and Sexual Activity  . Alcohol use: Yes    Comment: Occasionally.  . Drug use: No  . Sexual activity: Not on file  Other Topics Concern  . Not on file  Social History Narrative  . Not on file   Social Determinants of Health   Financial Resource Strain:   . Difficulty of Paying Living Expenses: Not on file  Food Insecurity:   . Worried About Charity fundraiser in the Last Year: Not on file  . Ran Out of Food in the Last Year: Not on file  Transportation Needs:   . Lack of Transportation (Medical): Not on file  . Lack of Transportation (Non-Medical): Not on file  Physical Activity:   . Days of Exercise per Week: Not on file  . Minutes of Exercise per Session: Not on file  Stress:   . Feeling of Stress : Not on file  Social Connections:   . Frequency of Communication with Friends and Family: Not on file  . Frequency of Social Gatherings with Friends and Family: Not on file  . Attends Religious Services: Not on file  . Active Member of Clubs or Organizations: Not on file  . Attends Club  or Organization Meetings: Not on file  . Marital Status: Not on file  Intimate Partner Violence:   . Fear of Current or Ex-Partner: Not on file  . Emotionally Abused: Not on file  . Physically Abused: Not on file  . Sexually Abused: Not on file    ROS Review of Systems  HENT: Negative.   Respiratory: Negative for cough, chest tightness and shortness of breath.   Cardiovascular: Negative for chest pain, palpitations and leg swelling.  Gastrointestinal: Negative.   Endocrine: Negative.   Genitourinary: Negative.   Musculoskeletal: Positive for joint swelling (Right knee history of arthroscopic surgery).       Left arm pain from freestyle libre sensor  Skin: Negative.     Neurological: Negative.   Hematological: Negative.   Psychiatric/Behavioral: Negative.     Objective:   Today's Vitals: BP 118/69 (BP Location: Right Arm, Patient Position: Sitting)   Pulse 74   Temp 98.1 F (36.7 C) (Temporal)   Ht 5' 5"  (1.651 m)   Wt 282 lb (127.9 kg)   LMP 01/17/2020   SpO2 97%   BMI 46.93 kg/m   Physical Exam Constitutional:      Appearance: She is obese.  HENT:     Head: Normocephalic.     Right Ear: Tympanic membrane normal.     Left Ear: Tympanic membrane normal.     Nose: Nose normal.     Mouth/Throat:     Mouth: Mucous membranes are moist.  Cardiovascular:     Rate and Rhythm: Normal rate and regular rhythm.     Pulses: Normal pulses.     Heart sounds: Normal heart sounds.  Pulmonary:     Effort: Pulmonary effort is normal.     Breath sounds: Normal breath sounds.  Abdominal:     Palpations: Abdomen is soft.  Musculoskeletal:        General: Tenderness present. Normal range of motion.       Arms:     Cervical back: Normal range of motion.  Skin:    General: Skin is warm and dry.     Capillary Refill: Capillary refill takes less than 2 seconds.  Neurological:     General: No focal deficit present.     Mental Status: She is alert and oriented to person, place, and time.  Psychiatric:        Mood and Affect: Mood normal.        Behavior: Behavior normal.        Thought Content: Thought content normal.        Judgment: Judgment normal.     Assessment & Plan:   Problem List Items Addressed This Visit      Other   Prediabetes - Primary Encouraged home glucose monitoring Weight loss at least 5% of current body weight is can be achieved with lifestyle modification dietary changes and regular daily exercise Encourage blood pressure control goal <120/80 and maintaining total cholesterol <200 Follow-up every 3 to 6 months for reevaluation     Other Visit Diagnoses    Personal history of COVID-19     FU xray to be scheduled    Morbid obesity with BMI of 45.0-49.9, adult ([place])     Obesity with BMI and comorbidities as noted above.  Discussed proper diet (low fat, low sodium, high fiber) with patient.   Discussed need for regular exercise (3 times per week, 20 minutes per session) with patient.       Outpatient Encounter Medications as  of 02/10/2020  Medication Sig  . Ascorbic Acid (VITAMIN C) 1000 MG tablet Take 1,000 mg by mouth daily.  . benzonatate (TESSALON PERLES) 100 MG capsule Take 1 capsule (100 mg total) by mouth 3 (three) times daily as needed for cough.  . Cholecalciferol (VITAMIN D-3) 25 MCG (1000 UT) CAPS Take by mouth.  . metFORMIN (GLUCOPHAGE) 500 MG tablet Take 1 tablet (500 mg total) by mouth 2 (two) times daily with a meal.  . zinc gluconate 50 MG tablet Take 50 mg by mouth daily.  Marland Kitchen acetaminophen (TYLENOL) 325 MG tablet Take 650 mg by mouth every 6 (six) hours as needed for mild pain or headache. (Patient not taking: Reported on 02/06/2020)  . aspirin EC 325 MG tablet Take 1 tablet (325 mg total) by mouth daily for 14 days. (Patient not taking: Reported on 02/06/2020)  . Blood Glucose Monitoring Suppl (TRUE METRIX METER) w/Device KIT 1 kit by Does not apply route in the morning and at bedtime.  . famotidine (PEPCID) 20 MG tablet Take 1 tablet (20 mg total) by mouth daily for 14 days. (Patient not taking: Reported on 02/06/2020)  . glucose blood (TRUE METRIX PRO BLOOD GLUCOSE) test strip Use as instructed  . [DISCONTINUED] Blood Glucose Monitoring Suppl (TRUE METRIX METER) w/Device KIT 1 kit by Does not apply route in the morning and at bedtime.  . [DISCONTINUED] Dextromethorphan Polistirex (ROBITUSSIN 12 HOUR COUGH PO) Take 20 mLs by mouth 2 (two) times daily as needed (for cough). (Patient not taking: Reported on 02/06/2020)  . [DISCONTINUED] glucose blood (TRUE METRIX PRO BLOOD GLUCOSE) test strip Use as instructed  . [DISCONTINUED] predniSONE (DELTASONE) 20 MG tablet Take 3 tablets once daily  for 3 days followed by 2 tablets once daily for 3 days followed by 1 tablet once daily for 3 days and then stop (Patient not taking: Reported on 02/06/2020)   No facility-administered encounter medications on file as of 02/10/2020.    Follow-up: Return in about 2 months (around 04/11/2020).   Vevelyn Francois, NP

## 2020-02-10 NOTE — Patient Instructions (Signed)

## 2020-02-27 ENCOUNTER — Ambulatory Visit (INDEPENDENT_AMBULATORY_CARE_PROVIDER_SITE_OTHER): Payer: HRSA Program | Admitting: Nurse Practitioner

## 2020-02-27 VITALS — BP 122/82 | HR 65 | Temp 97.5°F | Ht 65.0 in | Wt 286.0 lb

## 2020-02-27 DIAGNOSIS — U071 COVID-19: Secondary | ICD-10-CM

## 2020-02-27 DIAGNOSIS — J1282 Pneumonia due to coronavirus disease 2019: Secondary | ICD-10-CM

## 2020-02-27 NOTE — Progress Notes (Signed)
@Patient  ID: Mckenzie Miller, female    DOB: 11-26-1985, 34 y.o.   MRN: [id number]  Chief Complaint  Patient presents with  . Follow-up    No sx, feeling better    Referring provider: No ref. provider found   34 year old female with no significant health history.  Diagnosed with Covid on 01/23/2020.  HPI  Patient presents today for post Covid care clinic visit follow-up.  Patient states that since her last visit here she is doing much better.  She does need repeat imaging today.  Patient did get set up with primary care physician to follow diabetes.  She does have a follow-up visit with PCP scheduled in the next few weeks.  She denies any significant shortness of breath or any significant cough. Denies f/c/s, n/v/d, hemoptysis, PND, chest pain or edema.      Allergies  Allergen Reactions  . Sulfa Antibiotics Hives  . Sulfites Hives    Unsure which one, allergic      There is no immunization history on file for this patient.  History reviewed. No pertinent past medical history.  Tobacco History: Social History   Tobacco Use  Smoking Status Current Every Day Smoker  . Packs/day: 0.25  . Types: Cigarettes  Smokeless Tobacco Never Used   Ready to quit: No Counseling given: Yes   Outpatient Encounter Medications as of 02/27/2020  Medication Sig  . acetaminophen (TYLENOL) 325 MG tablet Take 650 mg by mouth every 6 (six) hours as needed for mild pain or headache.   . Ascorbic Acid (VITAMIN C) 1000 MG tablet Take 1,000 mg by mouth daily.  . benzonatate (TESSALON PERLES) 100 MG capsule Take 1 capsule (100 mg total) by mouth 3 (three) times daily as needed for cough.  . Blood Glucose Monitoring Suppl (TRUE METRIX METER) w/Device KIT 1 kit by Does not apply route in the morning and at bedtime.  . Cholecalciferol (VITAMIN D-3) 25 MCG (1000 UT) CAPS Take by mouth.  Marland Kitchen glucose blood (TRUE METRIX PRO BLOOD GLUCOSE) test strip Use as instructed  . metFORMIN (GLUCOPHAGE) 500 MG  tablet Take 1 tablet (500 mg total) by mouth 2 (two) times daily with a meal.  . zinc gluconate 50 MG tablet Take 50 mg by mouth daily.  . famotidine (PEPCID) 20 MG tablet Take 1 tablet (20 mg total) by mouth daily for 14 days. (Patient not taking: Reported on 02/06/2020)   No facility-administered encounter medications on file as of 02/27/2020.     Review of Systems  Review of Systems  Constitutional: Negative.  Negative for fatigue and fever.  HENT: Negative.   Respiratory: Negative for cough and shortness of breath.   Cardiovascular: Negative.  Negative for chest pain, palpitations and leg swelling.  Gastrointestinal: Negative.   Allergic/Immunologic: Negative.   Neurological: Negative.   Psychiatric/Behavioral: Negative.        Physical Exam  BP 122/82   Pulse 65   Temp (!) 97.5 F (36.4 C)   Ht 5' 5"  (1.651 m)   Wt 286 lb 0.1 oz (129.7 kg)   LMP 02/14/2020   SpO2 98%   BMI 47.59 kg/m   Wt Readings from Last 5 Encounters:  02/27/20 286 lb 0.1 oz (129.7 kg)  02/10/20 282 lb (127.9 kg)  02/06/20 281 lb 0.1 oz (127.5 kg)  01/23/20 283 lb 9.6 oz (128.6 kg)  12/24/16 (!) 335 lb 9 oz (152.2 kg)     Physical Exam Vitals and nursing note reviewed.  Constitutional:  General: She is not in acute distress.    Appearance: She is well-developed.  Cardiovascular:     Rate and Rhythm: Normal rate and regular rhythm.  Pulmonary:     Effort: Pulmonary effort is normal.     Breath sounds: Normal breath sounds.  Musculoskeletal:     Right lower leg: No edema.     Left lower leg: No edema.  Neurological:     Mental Status: She is alert and oriented to person, place, and time.  Psychiatric:        Mood and Affect: Mood normal.        Behavior: Behavior normal.        Assessment & Plan:   Pneumonia due to COVID-19 virus Stay well hydrated  Stay active  Deep breathing exercises  Will order repeat chest xray    Follow up:  Follow up as  needed       Fenton Foy, NP 02/27/2020

## 2020-02-27 NOTE — Patient Instructions (Addendum)
History of Pneumonia due to COVID-19 virus:   Stay well hydrated  Stay active  Deep breathing exercises  Will order repeat chest xray    Follow up:  Follow up as needed

## 2020-02-27 NOTE — Assessment & Plan Note (Signed)
Stay well hydrated  Stay active  Deep breathing exercises  Will order repeat chest xray    Follow up:  Follow up as needed

## 2020-03-02 ENCOUNTER — Other Ambulatory Visit: Payer: Self-pay

## 2020-03-02 ENCOUNTER — Ambulatory Visit
Admission: RE | Admit: 2020-03-02 | Discharge: 2020-03-02 | Disposition: A | Discharge status: Home / Self Care | Payer: Self-pay | Source: Ambulatory Visit | Attending: Nurse Practitioner | Admitting: Nurse Practitioner

## 2020-03-06 ENCOUNTER — Telehealth: Payer: Self-pay | Admitting: Nurse Practitioner

## 2020-03-06 NOTE — Telephone Encounter (Signed)
-----   Message from Ivonne Andrew, NP sent at 03/05/2020  8:16 AM EST ----- Please call to let patient know that her chest was almost completely clear. Thanks.

## 2020-03-06 NOTE — Telephone Encounter (Signed)
Patient notified of results, verbally understood. No additional questions.

## 2020-04-11 ENCOUNTER — Ambulatory Visit (INDEPENDENT_AMBULATORY_CARE_PROVIDER_SITE_OTHER): Payer: Self-pay | Admitting: Nurse Practitioner

## 2020-04-11 ENCOUNTER — Encounter: Payer: Self-pay | Admitting: Nurse Practitioner

## 2020-04-11 ENCOUNTER — Other Ambulatory Visit: Payer: Self-pay

## 2020-04-11 VITALS — BP 130/92 | HR 60 | Temp 97.9°F | Ht 65.0 in | Wt 292.0 lb

## 2020-04-11 DIAGNOSIS — R7303 Prediabetes: Secondary | ICD-10-CM

## 2020-04-11 MED ORDER — METFORMIN HCL 500 MG PO TABS: 500.0000 mg | ORAL_TABLET | Freq: Two times a day (BID) | ORAL | 0 refills | Status: DC

## 2020-04-11 NOTE — Progress Notes (Signed)
[place], [place]  [address] Phone:  [phone]   Fax:  [phone]   Established Patient Office Visit  Subjective:  Patient ID: Mckenzie Miller, female    DOB: Jan 03, 1986  Age: 34 y.o. MRN: [id number]  CC:  Chief Complaint  Patient presents with  . Follow-up    HPI [place] presents for follow up. She has a recent history of prediabetes.  Current symptoms include: none. She is currently having dental problems. She has an apt today for extraction. Patient denies foot ulcerations, increased appetite, nausea, paresthesia of the feet, polydipsia, polyuria and vomiting. Evaluation to date has included: fasting blood sugar and hemoglobin A1C.  Home sugars: occasionally for prediabetes. Current treatment: more intensive attention to diet which has been somewhat effective and Discontinued metformin which has been unable to assess effectiveness. She admits that when she ran out of her medication she did not seek additional refills. She was under the impression that it was no longer needed. She has not not been able able to loose weight; weight is up 4 pounds.   History reviewed. No pertinent past medical history.  Past Surgical History:  Procedure Laterality Date  . KNEE SURGERY      Family History  Problem Relation Age of Onset  . Diabetes Mellitus II Mother     Social History   Socioeconomic History  . Marital status: Single    Spouse name: Not on file  . Number of children: Not on file  . Years of education: Not on file  . Highest education level: Not on file  Occupational History  . Not on file  Tobacco Use  . Smoking status: Former Smoker    Packs/day: 0.25    Types: Cigarettes    Quit date: 01/16/2020    Years since quitting: 0.2  . Smokeless tobacco: Never Used  Substance and Sexual Activity  . Alcohol use: Yes    Comment: Occasionally.  . Drug use: No  . Sexual activity: Not on file  Other Topics Concern   . Not on file  Social History Narrative  . Not on file   Social Determinants of Health   Financial Resource Strain: Not on file  Food Insecurity: Not on file  Transportation Needs: Not on file  Physical Activity: Not on file  Stress: Not on file  Social Connections: Not on file  Intimate Partner Violence: Not on file    Outpatient Medications Prior to Visit  Medication Sig Dispense Refill  . acetaminophen (TYLENOL) 325 MG tablet Take 650 mg by mouth every 6 (six) hours as needed for mild pain or headache.     . Ascorbic Acid (VITAMIN C) 1000 MG tablet Take 1,000 mg by mouth daily.    . Blood Glucose Monitoring Suppl (TRUE METRIX METER) w/Device KIT 1 kit by Does not apply route in the morning and at bedtime. 1 kit 0  . Cholecalciferol (VITAMIN D-3) 25 MCG (1000 UT) CAPS Take by mouth.    . famotidine (PEPCID) 20 MG tablet Take 1 tablet (20 mg total) by mouth daily for 14 days. (Patient not taking: Reported on 02/06/2020) 14 tablet 0  . glucose blood (TRUE METRIX PRO BLOOD GLUCOSE) test strip Use as instructed 100 each 12  . zinc gluconate 50 MG tablet Take 50 mg by mouth daily.    . benzonatate (TESSALON PERLES) 100 MG capsule Take 1 capsule (100 mg total) by mouth 3 (three) times daily  as needed for cough. 20 capsule 0  . metFORMIN (GLUCOPHAGE) 500 MG tablet Take 1 tablet (500 mg total) by mouth 2 (two) times daily with a meal. 60 tablet 0   No facility-administered medications prior to visit.    Allergies  Allergen Reactions  . Sulfa Antibiotics Hives  . Sulfites Hives    Unsure which one, allergic     ROS Review of Systems  HENT: Positive for dental problem.   Neurological: Positive for dizziness and headaches.      Objective:    Physical Exam Constitutional:      Appearance: She is obese.  HENT:     Head: Normocephalic and atraumatic.     Mouth/Throat:     Comments: Dental caries Cardiovascular:     Rate and Rhythm: Normal rate and regular rhythm.      Pulses: Normal pulses.     Heart sounds: Normal heart sounds.  Pulmonary:     Effort: Pulmonary effort is normal.     Breath sounds: Normal breath sounds.  Abdominal:     General: Bowel sounds are normal.     Palpations: Abdomen is soft.  Musculoskeletal:        General: Normal range of motion.     Cervical back: Normal range of motion.  Skin:    General: Skin is warm.     Capillary Refill: Capillary refill takes less than 2 seconds.  Neurological:     General: No focal deficit present.     Mental Status: She is alert and oriented to person, place, and time.  Psychiatric:        Mood and Affect: Mood normal.        Behavior: Behavior normal.        Thought Content: Thought content normal.        Judgment: Judgment normal.     BP (!) 130/92 (BP Location: Right Arm, Patient Position: Sitting)   Pulse 60   Temp 97.9 F (36.6 C) (Temporal)   Ht $R'5\' 5"'IU$  (1.651 m)   Wt 292 lb (132.5 kg)   SpO2 100%   BMI 48.59 kg/m  Wt Readings from Last 3 Encounters:  04/11/20 292 lb (132.5 kg)  02/27/20 286 lb 0.1 oz (129.7 kg)  02/10/20 282 lb (127.9 kg)     There are no preventive care reminders to display for this patient.  There are no preventive care reminders to display for this patient.  No results found for: TSH Lab Results  Component Value Date   WBC 5.8 02/06/2020   HGB 12.8 02/06/2020   HCT 38.2 02/06/2020   MCV 89 02/06/2020   PLT 221 02/06/2020   Lab Results  Component Value Date   NA 143 02/06/2020   K 5.3 (H) 02/06/2020   CO2 22 02/06/2020   GLUCOSE 88 02/06/2020   BUN 7 02/06/2020   CREATININE 0.99 02/06/2020   BILITOT 0.5 02/06/2020   ALKPHOS 68 02/06/2020   AST 30 02/06/2020   ALT 36 (H) 02/06/2020   PROT 6.9 02/06/2020   ALBUMIN 3.6 (L) 02/06/2020   CALCIUM 9.4 02/06/2020   ANIONGAP 10 01/28/2020   No results found for: CHOL No results found for: HDL No results found for: [facility] Lab Results  Component Value Date   TRIG 156 (H) 01/22/2020   No  results found for: [facility] Lab Results  Component Value Date   HGBA1C 6.4 (H) 01/23/2020      Assessment & Plan:   Problem List Items Addressed  This Visit      Other   Prediabetes - Primary Continue occasioinal home glucose monitoring Weight loss at least 5% of current body weight is can be achieved with lifestyle modification dietary changes and regular daily exercise Encourage blood pressure control goal <120/80 and maintaining total cholesterol <200 Follow-up every 3 to 6 months for reevaluation    Relevant Orders   POCT CBG (Fasting - Glucose)      Meds ordered this encounter  Medications  . metFORMIN (GLUCOPHAGE) 500 MG tablet    Sig: Take 1 tablet (500 mg total) by mouth 2 (two) times daily with a meal.    Dispense:  60 tablet    Refill:  0    Order Specific Question:   Supervising Provider    Answer:   Tresa Garter [id number]    Follow-up: Return for well woman physcial .... 6 mon for dm.    Vevelyn Francois, NP

## 2020-04-11 NOTE — Patient Instructions (Signed)
Health Maintenance, Female Adopting a healthy lifestyle and getting preventive care are important in promoting health and wellness. Ask your health care provider about:  The right schedule for you to have regular tests and exams.  Things you can do on your own to prevent diseases and keep yourself healthy. What should I know about diet, weight, and exercise? Eat a healthy diet   Eat a diet that includes plenty of vegetables, fruits, low-fat dairy products, and lean protein.  Do not eat a lot of foods that are high in solid fats, added sugars, or sodium. Maintain a healthy weight Body mass index (BMI) is used to identify weight problems. It estimates body fat based on height and weight. Your health care provider can help determine your BMI and help you achieve or maintain a healthy weight. Get regular exercise Get regular exercise. This is one of the most important things you can do for your health. Most adults should:  Exercise for at least 150 minutes each week. The exercise should increase your heart rate and make you sweat (moderate-intensity exercise).  Do strengthening exercises at least twice a week. This is in addition to the moderate-intensity exercise.  Spend less time sitting. Even light physical activity can be beneficial. Watch cholesterol and blood lipids Have your blood tested for lipids and cholesterol at 33 years of age, then have this test every 5 years. Have your cholesterol levels checked more often if:  Your lipid or cholesterol levels are high.  You are older than 33 years of age.  You are at high risk for heart disease. What should I know about cancer screening? Depending on your health history and family history, you may need to have cancer screening at various ages. This may include screening for:  Breast cancer.  Cervical cancer.  Colorectal cancer.  Skin cancer.  Lung cancer. What should I know about heart disease, diabetes, and high blood  pressure? Blood pressure and heart disease  High blood pressure causes heart disease and increases the risk of stroke. This is more likely to develop in people who have high blood pressure readings, are of African descent, or are overweight.  Have your blood pressure checked: ? Every 3-5 years if you are 18-39 years of age. ? Every year if you are 40 years old or older. Diabetes Have regular diabetes screenings. This checks your fasting blood sugar level. Have the screening done:  Once every three years after age 40 if you are at a normal weight and have a low risk for diabetes.  More often and at a younger age if you are overweight or have a high risk for diabetes. What should I know about preventing infection? Hepatitis B If you have a higher risk for hepatitis B, you should be screened for this virus. Talk with your health care provider to find out if you are at risk for hepatitis B infection. Hepatitis C Testing is recommended for:  Everyone born from 1945 through 1965.  Anyone with known risk factors for hepatitis C. Sexually transmitted infections (STIs)  Get screened for STIs, including gonorrhea and chlamydia, if: ? You are sexually active and are younger than 33 years of age. ? You are older than 33 years of age and your health care provider tells you that you are at risk for this type of infection. ? Your sexual activity has changed since you were last screened, and you are at increased risk for chlamydia or gonorrhea. Ask your health care provider if   you are at risk.  Ask your health care provider about whether you are at high risk for HIV. Your health care provider may recommend a prescription medicine to help prevent HIV infection. If you choose to take medicine to prevent HIV, you should first get tested for HIV. You should then be tested every 3 months for as long as you are taking the medicine. Pregnancy  If you are about to stop having your period (premenopausal) and  you may become pregnant, seek counseling before you get pregnant.  Take 400 to 800 micrograms (mcg) of folic acid every day if you become pregnant.  Ask for birth control (contraception) if you want to prevent pregnancy. Osteoporosis and menopause Osteoporosis is a disease in which the bones lose minerals and strength with aging. This can result in bone fractures. If you are 65 years old or older, or if you are at risk for osteoporosis and fractures, ask your health care provider if you should:  Be screened for bone loss.  Take a calcium or vitamin D supplement to lower your risk of fractures.  Be given hormone replacement therapy (HRT) to treat symptoms of menopause. Follow these instructions at home: Lifestyle  Do not use any products that contain nicotine or tobacco, such as cigarettes, e-cigarettes, and chewing tobacco. If you need help quitting, ask your health care provider.  Do not use street drugs.  Do not share needles.  Ask your health care provider for help if you need support or information about quitting drugs. Alcohol use  Do not drink alcohol if: ? Your health care provider tells you not to drink. ? You are pregnant, may be pregnant, or are planning to become pregnant.  If you drink alcohol: ? Limit how much you use to 0-1 drink a day. ? Limit intake if you are breastfeeding.  Be aware of how much alcohol is in your drink. In the [place] one drink equals one 12 oz bottle of beer (355 mL), one 5 oz glass of wine (148 mL), or one 1 oz glass of hard liquor (44 mL). General instructions  Schedule regular health, dental, and eye exams.  Stay current with your vaccines.  Tell your health care provider if: ? You often feel depressed. ? You have ever been abused or do not feel safe at home. Summary  Adopting a healthy lifestyle and getting preventive care are important in promoting health and wellness.  Follow your health care provider's instructions about healthy  diet, exercising, and getting tested or screened for diseases.  Follow your health care provider's instructions on monitoring your cholesterol and blood pressure. This information is not intended to replace advice given to you by your health care provider. Make sure you discuss any questions you have with your health care provider. Document Revised: 04/07/2018 Document Reviewed: 04/07/2018 Elsevier Patient Education  2020 Elsevier Inc.  

## 2020-04-28 NOTE — L&D Delivery Note (Signed)
OB/GYN Faculty Practice Delivery Note  ALENAH SARRIA is a 35 y.o. [id number] s/p SVD at [redacted]w[redacted]d. She was admitted for IOL due to postdates, fetal macrosomia, and polyhydramnios.   ROM: 4h 79m with clear fluid GBS Status: Negative  Delivery Date/Time: 03/26/21 at 0927  Delivery: Called to room and patient was complete and pushing. Head delivered direct OA. No nuchal cord present. A shoulder dystocia was encountered and no additional traction was placed on the infant's head. McRobert's maneuver was performed in addition to rotational maneuvers which allowed for release of the anterior shoulder. Shoulders and body then delivered in usual fashion. Total duration of shoulder dystocia between 45-60 seconds. Infant was placed on mother's abdomen, dried and stimulated. Cord clamped x 2 immediately and cut by delivering clinician in order to hand infant off to awaiting neonatal team for additional support. Cord blood and cord gas drawn. Placenta delivered spontaneously with gentle cord traction. Fundus firm with massage and Pitocin. Labia, perineum, vagina, and cervix were inspected, and bilateral labial abrasions were noted that were hemostatic and not repaired.   Placenta: Intact; 3VC - sent to pathology  Complications: Shoulder dystocia (~45-60 seconds in duration) Lacerations: Bilateral labial abrasions EBL: 50 cc Analgesia: Epidural  Infant: Viable female  APGARs 7 and 9  5143 g  Evalina Field, MD OB/GYN Fellow, Faculty Practice

## 2020-05-12 ENCOUNTER — Other Ambulatory Visit: Payer: Medicaid Other

## 2020-05-12 DIAGNOSIS — Z20822 Contact with and (suspected) exposure to covid-19: Secondary | ICD-10-CM

## 2020-05-14 ENCOUNTER — Other Ambulatory Visit: Payer: Medicaid Other

## 2020-05-15 LAB — NOVEL CORONAVIRUS, NAA: SARS-CoV-2, NAA: NOT DETECTED

## 2020-09-06 ENCOUNTER — Other Ambulatory Visit: Payer: Self-pay

## 2020-09-06 ENCOUNTER — Ambulatory Visit (INDEPENDENT_AMBULATORY_CARE_PROVIDER_SITE_OTHER): Payer: Medicaid Other

## 2020-09-06 ENCOUNTER — Ambulatory Visit: Payer: Self-pay

## 2020-09-06 VITALS — BP 133/88 | HR 70 | Ht 65.0 in | Wt 308.8 lb

## 2020-09-06 DIAGNOSIS — Z3481 Encounter for supervision of other normal pregnancy, first trimester: Secondary | ICD-10-CM

## 2020-09-06 DIAGNOSIS — O3680X0 Pregnancy with inconclusive fetal viability, not applicable or unspecified: Secondary | ICD-10-CM

## 2020-09-06 DIAGNOSIS — O0992 Supervision of high risk pregnancy, unspecified, second trimester: Secondary | ICD-10-CM | POA: Insufficient documentation

## 2020-09-06 DIAGNOSIS — O0993 Supervision of high risk pregnancy, unspecified, third trimester: Secondary | ICD-10-CM | POA: Insufficient documentation

## 2020-09-06 DIAGNOSIS — Z3491 Encounter for supervision of normal pregnancy, unspecified, first trimester: Secondary | ICD-10-CM | POA: Insufficient documentation

## 2020-09-06 MED ORDER — VITAFOL GUMMIES 3.33-0.333-34.8 MG PO CHEW: 1.0000 | CHEWABLE_TABLET | Freq: Every day | ORAL | 11 refills | Status: DC

## 2020-09-06 MED ORDER — BLOOD PRESSURE KIT DEVI: 1.0000 | 0 refills | Status: AC

## 2020-09-06 NOTE — Progress Notes (Signed)
Patient was assessed and managed by nursing staff during this encounter. I have reviewed the chart and agree with the documentation and plan. I have also made any necessary editorial changes.  Ayra Hodgdon, MD 09/06/2020 11:47 AM    

## 2020-09-06 NOTE — Progress Notes (Signed)
DATING AND VIABILITY SONOGRAM   NALIAH EDDINGTON is a 35 y.o. year old G13P2002 with LMP Patient's last menstrual period was 06/16/2020. which would correlate to  [redacted]w[redacted]d weeks gestation.  She has regular menstrual cycles.   She is here today for a confirmatory initial sonogram.    GESTATION: SINGLETON Pregnancy     FETAL ACTIVITY:          Heart rate         160          The fetus is active.  GESTATIONAL AGE AND  BIOMETRICS:  Gestational criteria: Estimated Date of Delivery: 03/23/21 by LMP now at [redacted]w[redacted]d  Previous Scans:0  GESTATIONAL SAC                      CROWN RUMP LENGTH         5.15 cm         11 weeks                                                                               AVERAGE EGA(BY THIS SCAN):  [redacted]w[redacted]d  WORKING EDD( LMP ):  03/23/21     TECHNICIAN COMMENTS:  Single live IUP at [redacted]w[redacted]d by CRL. FHR 160. Patient scheduled for New OB for 09/12/20.   A copy of this report including all images has been saved and backed up to a second source for retrieval if needed. All measures and details of the anatomical scan, placentation, fluid volume and pelvic anatomy are contained in that report.  Celestia Khat Stephnie Parlier 09/06/2020 11:33 AM

## 2020-09-06 NOTE — Progress Notes (Signed)
New OB Intake  I connected with  Mckenzie Miller on 09/06/20 at 10:15 AM EDT by In person and verified that I am speaking with the correct person using two identifiers. Nurse is located at [facility] and pt is located at [place] office.  I discussed the limitations, risks, security and privacy concerns of performing an evaluation and management service by telephone and the availability of in person appointments. I also discussed with the patient that there may be a patient responsible charge related to this service. The patient expressed understanding and agreed to proceed.  I explained I am completing New OB Intake today. We discussed her EDD of 03/23/21 that is based on LMP of 06/16/20. Pt is G3/P1. I reviewed her allergies, medications, Medical/Surgical/OB history, and appropriate screenings. I informed her of [facility] services. Based on history, this is a/an uncomplicated pregnancy.  Patient Active Problem List   Diagnosis Date Noted  . Encounter for supervision of normal pregnancy in first trimester 09/06/2020  . Prediabetes 02/06/2020  . Pneumonia due to COVID-19 virus 01/22/2020    Concerns addressed today  Delivery Plans:  Plans to deliver at [facility].   MyChart/Babyscripts MyChart access verified. I explained pt will have some visits in office and some virtually. Babyscripts instructions given and order placed. Patient verifies receipt of registration text/e-mail. Account successfully created and app downloaded.  Blood Pressure Cuff Blood pressure cuff ordered for patient to pick-up from [employer]. Explained after first prenatal appt pt will check weekly and document in Babyscripts.  Anatomy US Explained first scheduled [place] will be around 19 weeks. Dating and Viability scan done today  Labs Discussed Avelina Laine genetic screening with patient. Would like both Panorama and Horizon drawn at new OB visit. Routine prenatal labs needed.  Covid Vaccine Patient has not covid vaccine.    Social Determinants of Health . Food Insecurity: Patient denies food insecurity. . WIC Referral: Patient is interested in referral to [facility].  . Transportation: Patient denies transportation needs. . Childcare: Discussed no children allowed at ultrasound appointments. Offered childcare services; patient declines childcare services at this time.  First visit review I reviewed new OB appt with pt. I explained she will have a pelvic exam, ob bloodwork with genetic screening, and PAP smear. Explained pt will be seen by Coral Ceo, MD at first visit; encounter routed to appropriate provider. Explained that patient will be seen by pregnancy navigator following visit with provider.  Hamilton Capri, RN 09/06/2020  10:37 AM

## 2020-09-12 ENCOUNTER — Other Ambulatory Visit: Payer: Self-pay

## 2020-09-12 ENCOUNTER — Other Ambulatory Visit (HOSPITAL_COMMUNITY)
Admission: RE | Admit: 2020-09-12 | Discharge: 2020-09-12 | Disposition: A | Discharge status: Home / Self Care | Payer: Medicaid Other | Source: Ambulatory Visit | Attending: Obstetrics | Admitting: Obstetrics

## 2020-09-12 ENCOUNTER — Encounter: Payer: Medicaid Other | Admitting: Obstetrics

## 2020-09-12 ENCOUNTER — Ambulatory Visit (INDEPENDENT_AMBULATORY_CARE_PROVIDER_SITE_OTHER): Payer: Medicaid Other | Admitting: Women's Health

## 2020-09-12 VITALS — BP 130/87 | HR 70 | Wt 308.8 lb

## 2020-09-12 DIAGNOSIS — Z3A12 12 weeks gestation of pregnancy: Secondary | ICD-10-CM | POA: Diagnosis not present

## 2020-09-12 DIAGNOSIS — O99891 Other specified diseases and conditions complicating pregnancy: Secondary | ICD-10-CM

## 2020-09-12 DIAGNOSIS — R011 Cardiac murmur, unspecified: Secondary | ICD-10-CM

## 2020-09-12 DIAGNOSIS — Z3481 Encounter for supervision of other normal pregnancy, first trimester: Secondary | ICD-10-CM | POA: Diagnosis not present

## 2020-09-12 DIAGNOSIS — O361991 Maternal care for other isoimmunization, unspecified trimester, fetus 1: Secondary | ICD-10-CM

## 2020-09-12 DIAGNOSIS — Z975 Presence of (intrauterine) contraceptive device: Secondary | ICD-10-CM | POA: Insufficient documentation

## 2020-09-12 DIAGNOSIS — M25561 Pain in right knee: Secondary | ICD-10-CM

## 2020-09-12 DIAGNOSIS — R8271 Bacteriuria: Secondary | ICD-10-CM

## 2020-09-12 DIAGNOSIS — R7303 Prediabetes: Secondary | ICD-10-CM

## 2020-09-12 DIAGNOSIS — F1290 Cannabis use, unspecified, uncomplicated: Secondary | ICD-10-CM

## 2020-09-12 DIAGNOSIS — G8929 Other chronic pain: Secondary | ICD-10-CM

## 2020-09-12 DIAGNOSIS — O99210 Obesity complicating pregnancy, unspecified trimester: Secondary | ICD-10-CM

## 2020-09-12 LAB — HEPATITIS C ANTIBODY: HCV Ab: NEGATIVE

## 2020-09-12 MED ORDER — ASPIRIN EC 81 MG PO TBEC: 81.0000 mg | DELAYED_RELEASE_TABLET | Freq: Every day | ORAL | 6 refills | Status: DC

## 2020-09-12 NOTE — Progress Notes (Signed)
History:   Mckenzie Miller is a 35 y.o. G3P2002 at 53w4dby early ultrasound being seen today for her first obstetrical visit.  Her obstetrical history is significant for obesity. Patient does intend to breast feed. Pregnancy history fully reviewed.  Allergies: Sulfa Current Medications: PNVs PMH: asthma as child, not as adult, prediabetes. No HTN, DM. PSH: knee surgery (2000 - for cartilage deterioration right side, still has pain and fluid build up, was previously taking Lasix, but not at this time) OB Hx: 2009 (full-term, NSVD), 2010 (full-term, NSVD) Social Hx: pt does not smoke, drink. Pt reports using marijuana, but reports she will stop. Family Hx: younger brother is developmentally delayed, one of patient's children was born with heart murmur that corrected itself, denies structural heart defects  Patient reports no complaints.      HISTORY: OB History  Gravida Para Term Preterm AB Living  _0 0 0 2  SAB IAB Ectopic Multiple Live Births  0 0 0 0 2    # Outcome Date GA Lbr Len/2nd Weight Sex Delivery Anes PTL Lv  3 Current           2 Term 09/19/08    F Vag-Spont   LIV  1 Term 05/13/07    M Vag-Spont   LIV    Last pap smear was done 2010 and was normal.  No past medical history on file. Past Surgical History:  Procedure Laterality Date  . KNEE SURGERY     Family History  Problem Relation Age of Onset  . Diabetes Mellitus II Mother   . Kidney failure Mother   . Breast cancer Mother 516  Social History   Tobacco Use  . Smoking status: Former Smoker    Packs/day: 0.25    Types: Cigarettes    Quit date: 01/16/2020    Years since quitting: 0.6  . Smokeless tobacco: Never Used  Vaping Use  . Vaping Use: Never used  Substance Use Topics  . Alcohol use: Not Currently    Comment: Not since confirmed pregnancy  . Drug use: Yes    Types: Marijuana    Comment: 1 x a week   Allergies  Allergen Reactions  . Sulfa Antibiotics Hives  . Sulfites Hives     Unsure which one, allergic    Current Outpatient Medications on File Prior to Visit  Medication Sig Dispense Refill  . acetaminophen (TYLENOL) 325 MG tablet Take 650 mg by mouth every 6 (six) hours as needed for mild pain or headache.     . Blood Pressure Monitoring (BLOOD PRESSURE KIT) DEVI 1 kit by Does not apply route once a week. 1 each 0  . Prenatal Vit-Fe Phos-FA-Omega (VITAFOL GUMMIES) 3.33-0.333-34.8 MG CHEW Chew 1 tablet by mouth daily. 90 tablet 11  . Ascorbic Acid (VITAMIN C) 1000 MG tablet Take 1,000 mg by mouth daily. (Patient not taking: No sig reported)    . Blood Glucose Monitoring Suppl (TRUE METRIX METER) w/Device KIT 1 kit by Does not apply route in the morning and at bedtime. (Patient not taking: No sig reported) 1 kit 0  . Cholecalciferol (VITAMIN D-3) 25 MCG (1000 UT) CAPS Take by mouth. (Patient not taking: No sig reported)    . glucose blood (TRUE METRIX PRO BLOOD GLUCOSE) test strip Use as instructed (Patient not taking: No sig reported) 100 each 12  . Prenatal Vit-Fe Fumarate-FA (MULTIVITAMIN-PRENATAL) 27-0.8 MG TABS tablet Take 1 tablet by mouth daily at 12 noon. (Patient not  taking: Reported on 09/12/2020)    . zinc gluconate 50 MG tablet Take 50 mg by mouth daily. (Patient not taking: No sig reported)     No current facility-administered medications on file prior to visit.    Review of Systems Pertinent items noted in HPI and remainder of comprehensive ROS otherwise negative.  Physical Exam:   Vitals:   09/12/20 0849  BP: 130/87  Pulse: 70  Weight: (!) 308 lb 12.8 oz (140.1 kg)   Fetal Heart Rate (bpm): 152 Bedside Ultrasound for FHR check: Viable intrauterine pregnancy with positive cardiac activity noted, fetal heart rate 152bpm Patient informed that the ultrasound is considered a limited obstetric ultrasound and is not intended to be a complete ultrasound exam.  Patient also informed that the ultrasound is not being completed with the intent of assessing  for fetal or placental anomalies or any pelvic abnormalities.  Explained that the purpose of today's ultrasound is to assess for fetal heart rate.  Patient acknowledges the purpose of the exam and the limitations of the study. General: well-developed, well-nourished female in no acute distress  Breasts:  normal appearance, no masses or tenderness bilaterally  Skin: normal coloration and turgor, no rashes  Neurologic: oriented, normal, negative, normal mood  Extremities: normal strength, tone, and muscle mass, ROM of all joints is normal  HEENT PERRLA, extraocular movement intact and sclera clear, anicteric  Neck supple and no masses  Cardiovascular: regular rate and rhythm  Respiratory:  no respiratory distress, normal breath sounds  Abdomen: soft, non-tender; bowel sounds normal; no masses,  no organomegaly  Pelvic: normal external genitalia, no lesions, normal vaginal mucosa, normal vaginal discharge, normal cervix, pap smear done. Uterine size: c/w      Assessment:    Pregnancy: [id number] Patient Active Problem List   Diagnosis Date Noted  . Obesity in pregnancy 09/12/2020  . Chronic pain of right knee 09/12/2020  . Marijuana use 09/12/2020  . Nexplanon in place 09/12/2020  . Heart murmur of newborn 09/12/2020  . Encounter for supervision of normal pregnancy in first trimester 09/06/2020  . Prediabetes 02/06/2020  . Pneumonia due to COVID-19 virus 01/22/2020     Plan:    1. Encounter for supervision of other normal pregnancy in first trimester - Cytology - PAP( [place]) - Cervicovaginal ancillary only( [place]) - Obstetric Panel, Including HIV - Hepatitis C Antibody - Genetic Screening - Culture, OB Urine - [place] MFM OB DETAIL +14 WK; Future - aspirin EC 81 MG tablet; Take 1 tablet (81 mg total) by mouth daily. Swallow whole.  Dispense: 30 tablet; Refill: 6  2. [redacted] weeks gestation of pregnancy  3. Prediabetes - Hemoglobin A1c  4. Obesity in pregnancy - aspirin EC 81  MG tablet; Take 1 tablet (81 mg total) by mouth daily. Swallow whole.  Dispense: 30 tablet; Refill: 6 - Comp Met (CMET) - Protein / creatinine ratio, urine  5. Chronic pain of right knee - knee surgery (2000 - for cartilage deterioration right side, still has pain and fluid build up, was previously taking Lasix, but not at this time). Referred to PT, will consider ortho referral pending PT evaluation. - Ambulatory referral to Physical Therapy  6. Marijuana use - reports using marijuana, but will stop d/t fear of CPS involvement.  7. Nexplanon in place -Pt reports Nexplanon in place. Was placed approximately 12 years ago. Nexplanon unable to be palpated by provider or patient. Patient mentioned while at check out desk, will discuss further at next  visit and will consider ordering [place] Left Upper Extrem LTD Soft Tissue for localization and possible consult with general surgery.  Initial labs drawn. Continue prenatal vitamins. Problem list reviewed and updated. Genetic Screening discussed, NIPS: requested. Ultrasound discussed; fetal anatomic survey: ordered. Anticipatory guidance about prenatal visits given including labs, ultrasounds, and testing. Discussed usage of [employer] and virtual visits as additional source of managing and completing prenatal visits in midst of coronavirus and pandemic.   Encouraged to complete MyChart Registration for her ability to review results, send requests, and have questions addressed.  The nature of [facility] with multiple MDs and Advanced Practice Providers was explained to patient; also emphasized that residents, students are part of our team. Routine obstetric precautions reviewed. Encouraged to seek out care at office or emergency room [facility] preferred) for urgent and/or emergent concerns. Return in about 4 weeks (around 10/10/2020) for in-person LOB/APP OK, needs anatomy scan.     Clarisa Fling, NP   1:34 PM 09/12/2020

## 2020-09-12 NOTE — Progress Notes (Signed)
NOB in office, intake and U/S completed 09-06-20.

## 2020-09-12 NOTE — Patient Instructions (Addendum)
Maternity Assessment Unit (MAU)  The Maternity Assessment Unit (MAU) is located at the [facility] at [facility]. The address is: [address], [place], [place], [place] [address]. Please see map below for additional directions.    The Maternity Assessment Unit is designed to help you during your pregnancy, and for up to 6 weeks after delivery, with any pregnancy- or postpartum-related emergencies, if you think you are in labor, or if your water has broken. For example, if you experience nausea and vomiting, vaginal bleeding, severe abdominal or pelvic pain, elevated blood pressure or other problems related to your pregnancy or postpartum time, please come to the Maternity Assessment Unit for assistance.        Second Trimester of Pregnancy  The second trimester of pregnancy is from week 13 through week 27. This is months 4 through 6 of pregnancy. The second trimester is often a time when you feel your best. Your body has adjusted to being pregnant, and you begin to feel better physically. During the second trimester:  Morning sickness has lessened or stopped completely.  You may have more energy.  You may have an increase in appetite. The second trimester is also a time when the unborn baby (fetus) is growing rapidly. At the end of the sixth month, the fetus may be up to 12 inches long and weigh about 1 pounds. You will likely begin to feel the baby move (quickening) between 16 and 20 weeks of pregnancy. Body changes during your second trimester Your body continues to go through many changes during your second trimester. The changes vary and generally return to normal after the baby is born. Physical changes  Your weight will continue to increase. You will notice your lower abdomen bulging out.  You may begin to get stretch marks on your hips, abdomen, and breasts.  Your breasts will continue to grow and to become tender.  Dark spots or  blotches (chloasma or mask of pregnancy) may develop on your face.  A dark line from your belly button to the pubic area (linea nigra) may appear.  You may have changes in your hair. These can include thickening of your hair, rapid growth, and changes in texture. Some people also have hair loss during or after pregnancy, or hair that feels dry or thin. Health changes  You may develop headaches.  You may have heartburn.  You may develop constipation.  You may develop hemorrhoids or swollen, bulging veins (varicose veins).  Your gums may bleed and may be sensitive to brushing and flossing.  You may urinate more often because the fetus is pressing on your bladder.  You may have back pain. This is caused by: ? Weight gain. ? Pregnancy hormones that are relaxing the joints in your pelvis. ? A shift in weight and the muscles that support your balance. Follow these instructions at home: Medicines  Follow your health care provider's instructions regarding medicine use. Specific medicines may be either safe or unsafe to take during pregnancy. Do not take any medicines unless approved by your health care provider.  Take a prenatal vitamin that contains at least 600 micrograms (mcg) of folic acid. Eating and drinking  Eat a healthy diet that includes fresh fruits and vegetables, whole grains, good sources of protein such as meat, eggs, or tofu, and low-fat dairy products.  Avoid raw meat and unpasteurized juice, milk, and cheese. These carry germs that can harm you and your baby.  You may need  to take these actions to prevent or treat constipation: ? Drink enough fluid to keep your urine pale yellow. ? Eat foods that are high in fiber, such as beans, whole grains, and fresh fruits and vegetables. ? Limit foods that are high in fat and processed sugars, such as fried or sweet foods. Activity  Exercise only as directed by your health care provider. Most people can continue their usual  exercise routine during pregnancy. Try to exercise for 30 minutes at least 5 days a week. Stop exercising if you develop contractions in your uterus.  Stop exercising if you develop pain or cramping in the lower abdomen or lower back.  Avoid exercising if it is very hot or humid or if you are at a high altitude.  Avoid heavy lifting.  If you choose to, you may have sex unless your health care provider tells you not to. Relieving pain and discomfort  Wear a supportive bra to prevent discomfort from breast tenderness.  Take warm sitz baths to soothe any pain or discomfort caused by hemorrhoids. Use hemorrhoid cream if your health care provider approves.  Rest with your legs raised (elevated) if you have leg cramps or low back pain.  If you develop varicose veins: ? Wear support hose as told by your health care provider. ? Elevate your feet for 15 minutes, 3-4 times a day. ? Limit salt in your diet. Safety  Wear your seat belt at all times when driving or riding in a car.  Talk with your health care provider if someone is verbally or physically abusive to you. Lifestyle  Do not use hot tubs, steam rooms, or saunas.  Do not douche. Do not use tampons or scented sanitary pads.  Avoid cat litter boxes and soil used by cats. These carry germs that can cause birth defects in the baby and possibly loss of the fetus by miscarriage or stillbirth.  Do not use herbal remedies, alcohol, illegal drugs, or medicines that are not approved by your health care provider. Chemicals in these products can harm your baby.  Do not use any products that contain nicotine or tobacco, such as cigarettes, e-cigarettes, and chewing tobacco. If you need help quitting, ask your health care provider. General instructions  During a routine prenatal visit, your health care provider will do a physical exam and other tests. He or she will also discuss your overall health. Keep all follow-up visits. This is  important.  Ask your health care provider for a referral to a local prenatal education class.  Ask for help if you have counseling or nutritional needs during pregnancy. Your health care provider can offer advice or refer you to specialists for help with various needs. Where to find more information  American Pregnancy Association: americanpregnancy.org  [employer] and Gynecologists: https://www.todd-brady.net/  Office on [employer] Health: MightyReward.co.nz Contact a health care provider if you have:  A headache that does not go away when you take medicine.  Vision changes or you see spots in front of your eyes.  Mild pelvic cramps, pelvic pressure, or nagging pain in the abdominal area.  Persistent nausea, vomiting, or diarrhea.  A bad-smelling vaginal discharge or foul-smelling urine.  Pain when you urinate.  Sudden or extreme swelling of your face, hands, ankles, feet, or legs.  A fever. Get help right away if you:  Have fluid leaking from your vagina.  Have spotting or bleeding from your vagina.  Have severe abdominal cramping or pain.  Have difficulty  breathing.  Have chest pain.  Have fainting spells.  Have not felt your baby move for the time period told by your health care provider.  Have new or increased pain, swelling, or redness in an arm or leg. Summary  The second trimester of pregnancy is from week 13 through week 27 (months 4 through 6).  Do not use herbal remedies, alcohol, illegal drugs, or medicines that are not approved by your health care provider. Chemicals in these products can harm your baby.  Exercise only as directed by your health care provider. Most people can continue their usual exercise routine during pregnancy.  Keep all follow-up visits. This is important. This information is not intended to replace advice given to you by your health care provider. Make sure you discuss any questions you  have with your health care provider. Document Revised: 09/21/2019 Document Reviewed: 07/28/2019 Elsevier Patient Education  2021 Elsevier Inc.        Round Ligament Pain  The round ligament is a cord of muscle and tissue that helps support the uterus. It can become a source of pain during pregnancy if it becomes stretched or twisted as the baby grows. The pain usually begins in the second trimester (13-28 weeks) of pregnancy, and it can come and go until the baby is delivered. It is not a serious problem, and it does not cause harm to the baby. Round ligament pain is usually a short, sharp, and pinching pain, but it can also be a dull, lingering, and aching pain. The pain is felt in the lower side of the abdomen or in the groin. It usually starts deep in the groin and moves up to the outside of the hip area. The pain may occur when you:  Suddenly change position, such as quickly going from a sitting to standing position.  Roll over in bed.  Cough or sneeze.  Do physical activity. Follow these instructions at home:  Watch your condition for any changes.  When the pain starts, relax. Then try any of these methods to help with the pain: ? Sitting down. ? Flexing your knees up to your abdomen. ? Lying on your side with one pillow under your abdomen and another pillow between your legs. ? Sitting in a warm bath for 15-20 minutes or until the pain goes away.  Take over-the-counter and prescription medicines only as told by your health care provider.  Move slowly when you sit down or stand up.  Avoid long walks if they cause pain.  Stop or reduce your physical activities if they cause pain.  Keep all follow-up visits as told by your health care provider. This is important.   Contact a health care provider if:  Your pain does not go away with treatment.  You feel pain in your back that you did not have before.  Your medicine is not helping. Get help right away if:  You have  a fever or chills.  You develop uterine contractions.  You have vaginal bleeding.  You have nausea or vomiting.  You have diarrhea.  You have pain when you urinate. Summary  Round ligament pain is felt in the lower abdomen or groin. It is usually a short, sharp, and pinching pain. It can also be a dull, lingering, and aching pain.  This pain usually begins in the second trimester (13-28 weeks). It occurs because the uterus is stretching with the growing baby, and it is not harmful to the baby.  You may notice the  pain when you suddenly change position, when you cough or sneeze, or during physical activity.  Relaxing, flexing your knees to your abdomen, lying on one side, or taking a warm bath may help to get rid of the pain.  Get help from your health care provider if the pain does not go away or if you have vaginal bleeding, nausea, vomiting, diarrhea, or painful urination. This information is not intended to replace advice given to you by your health care provider. Make sure you discuss any questions you have with your health care provider. Document Revised: 09/30/2017 Document Reviewed: 09/30/2017 Elsevier Patient Education  2021 Elsevier Inc.        Marijuana Use During Pregnancy and Breastfeeding Marijuana is the dried leaves, flowers, and stems of the Cannabis sativa or Cannabis indica plant. The plants have many active ingredients, including a chemical called THC. Some of these chemicals, especially THC, change the way the brain works. Marijuana smoke also has many of the same chemicals as cigarette smoke that cause breathing problems. Marijuana can be inhaled by smoking or vaping. It can be used on the skin as a cream, lotion, gel, or patch. It can also be taken by mouth in the form of cookies, candy, or drinks. Using marijuana in any form may be harmful for you and your baby when you are trying to become pregnant and during pregnancy. This includes marijuana that is  prescribed to you by a health care provider (medical marijuana). Once marijuana is in your blood, it can travel through your placenta to your baby. It may also pass through breast milk. How does using marijuana affect me? It can affect your general health Marijuana affects you both mentally and physically. Using marijuana can make you feel high and relaxed. It can also have negative effects, especially at high doses or with long-term use. These include:  Rapid heartbeat and stress on your heart.  Lung irritation and breathing problems.  Difficulty thinking and making decisions.  Seeing or believing things that are not true (hallucinations and paranoia).  Mood swings, depression, or anxiety.  Decreased ability to learn and remember. It can affect your pregnancy Marijuana can also affect your pregnancy. Not all the effects are known. However, if you use marijuana during pregnancy, you may:  Have difficulty getting pregnant.  Be less likely to get regular prenatal care and do the things that you need to do to have a healthy pregnancy.  Be more likely to use other drugs that can harm your pregnancy, like drinking alcohol and smoking cigarettes.  Be at higher risk of having your baby die after 28 weeks of pregnancy. This is called stillbirth.  Be at higher risk of giving birth before 37 weeks of pregnancy (premature birth). How does using marijuana affect my baby? If you use marijuana during pregnancy, this may affect your baby's development, birth, and life after birth. Your baby may:  Be born prematurely, which can cause physical and mental health conditions.  Be born with a low birth weight, which can lead to physical and mental health conditions.  Have problems with brain development.  Have difficulty growing.  Have attention and behavior problems later in life.  Do poorly in school and have trouble learning later in life.  Have trouble with vision and coordination.  Be at  higher risk for using marijuana by age 70. More research is needed to find out exactly how marijuana affects a baby during breastfeeding. Some studies suggest that the chemicals in marijuana  can be passed to a baby through breast milk. To limit possible risks, you should not use marijuana during breastfeeding. General tips and recommendations  Do not use marijuana in any form when you are trying to get pregnant, when you are pregnant, or when you are breastfeeding. If you are having trouble stopping marijuana use, ask your health care provider for help.  If you are using medical marijuana, ask your health care provider to change to a medicine that is safer to use during pregnancy or breastfeeding.  Let your health care provider know if you use marijuana before trying to get pregnant, during pregnancy, or during breastfeeding. Follow these instructions at home:  Do not use any products that contain nicotine or tobacco. These products include cigarettes, chewing tobacco, and vaping devices, such as e-cigarettes. If you need help quitting, ask your health care provider.  Keep all your prenatal visits. This is important.   Where to find more information [employer] on Drug Abuse: www.drugabuse.gov March of Dimes: www.marchofdimes.org/pregnancy Contact a health care provider if:  You use marijuana and want to get pregnant.  You use marijuana during pregnancy or breastfeeding.  You need help stopping marijuana use. Get help right away if:  Your baby is not gaining weight or growing as expected. Summary  Using marijuana in any form may be harmful for you and your baby when you are trying to become pregnant, during pregnancy, and during breastfeeding. This includes marijuana that is prescribed to you (medical marijuana).  Some studies suggest that marijuana may pass through breast milk and can affect your baby's brain development.  Talk to your health care provider if you use  marijuana in any form while trying to get pregnant, during pregnancy, or while breastfeeding.  Ask your health care provider for help if you are not able to stop using marijuana. This information is not intended to replace advice given to you by your health care provider. Make sure you discuss any questions you have with your health care provider. Document Revised: 10/17/2019 Document Reviewed: 10/17/2019 Elsevier Patient Education  2021 Elsevier Inc.                        Safe Medications in Pregnancy    Acne: Benzoyl Peroxide Salicylic Acid  Backache/Headache: Tylenol: 2 regular strength every 4 hours OR              2 Extra strength every 6 hours  Colds/Coughs/Allergies: Benadryl (alcohol free) 25 mg every 6 hours as needed Breath right strips Claritin Cepacol throat lozenges Chloraseptic throat spray Cold-Eeze- up to three times per day Cough drops, alcohol free Flonase (by prescription only) Guaifenesin Mucinex Robitussin DM (plain only, alcohol free) Saline nasal spray/drops Sudafed (pseudoephedrine) & Actifed ** use only after [redacted] weeks gestation and if you do not have high blood pressure Tylenol Vicks Vaporub Zinc lozenges Zyrtec   Constipation: Colace Ducolax suppositories Fleet enema Glycerin suppositories Metamucil Milk of magnesia Miralax Senokot Smooth move tea  Diarrhea: Kaopectate Imodium A-D  *NO pepto Bismol  Hemorrhoids: Anusol Anusol HC Preparation H Tucks  Indigestion: Tums Maalox Mylanta Zantac  Pepcid  Insomnia: Benadryl (alcohol free) 25mg  every 6 hours as needed Tylenol PM Unisom, no Gelcaps  Leg Cramps: Tums MagGel  Nausea/Vomiting:  Bonine Dramamine Emetrol Ginger extract Sea bands Meclizine  Nausea medication to take during pregnancy:  Unisom (doxylamine succinate 25 mg tablets) Take one tablet daily at bedtime. If symptoms are not adequately controlled, the  dose can be increased to a maximum  recommended dose of two tablets daily (1/2 tablet in the morning, 1/2 tablet mid-afternoon and one at bedtime). Vitamin B6 100mg  tablets. Take one tablet twice a day (up to 200 mg per day).  Skin Rashes: Aveeno products Benadryl cream or 25mg  every 6 hours as needed Calamine Lotion 1% cortisone cream  Yeast infection: Gyne-lotrimin 7 Monistat 7   **If taking multiple medications, please check labels to avoid duplicating the same active ingredients **take medication as directed on the label ** Do not exceed 4000 mg of tylenol in 24 hours **Do not take medications that contain aspirin or ibuprofen

## 2020-09-13 LAB — CERVICOVAGINAL ANCILLARY ONLY
Chlamydia: NEGATIVE
Comment: NEGATIVE
Comment: NORMAL
Neisseria Gonorrhea: NEGATIVE

## 2020-09-14 ENCOUNTER — Other Ambulatory Visit: Payer: Self-pay

## 2020-09-14 DIAGNOSIS — Z3481 Encounter for supervision of other normal pregnancy, first trimester: Secondary | ICD-10-CM

## 2020-09-14 LAB — COMPREHENSIVE METABOLIC PANEL
ALT: 22 IU/L (ref 0–32)
AST: 21 IU/L (ref 0–40)
Albumin/Globulin Ratio: 1.5 (ref 1.2–2.2)
Albumin: 3.8 g/dL (ref 3.8–4.8)
Alkaline Phosphatase: 60 IU/L (ref 44–121)
BUN/Creatinine Ratio: 8 — ABNORMAL LOW (ref 9–23)
BUN: 6 mg/dL (ref 6–20)
Bilirubin Total: 0.4 mg/dL (ref 0.0–1.2)
CO2: 19 mmol/L — ABNORMAL LOW (ref 20–29)
Calcium: 8.8 mg/dL (ref 8.7–10.2)
Chloride: 104 mmol/L (ref 96–106)
Creatinine, Ser: 0.72 mg/dL (ref 0.57–1.00)
Globulin, Total: 2.6 g/dL (ref 1.5–4.5)
Glucose: 93 mg/dL (ref 65–99)
Potassium: 4 mmol/L (ref 3.5–5.2)
Sodium: 138 mmol/L (ref 134–144)
Total Protein: 6.4 g/dL (ref 6.0–8.5)
eGFR: 112 mL/min/{1.73_m2} (ref >59)

## 2020-09-14 LAB — OBSTETRIC PANEL, INCLUDING HIV
Basophils Absolute: 0 10*3/uL (ref 0.0–0.2)
Basos: 0 %
EOS (ABSOLUTE): 0.1 10*3/uL (ref 0.0–0.4)
Eos: 2 %
HIV Screen 4th Generation wRfx: NONREACTIVE
Hematocrit: 36.6 % (ref 34.0–46.6)
Hemoglobin: 12.4 g/dL (ref 11.1–15.9)
Hepatitis B Surface Ag: NEGATIVE
Immature Grans (Abs): 0.1 10*3/uL (ref 0.0–0.1)
Immature Granulocytes: 1 %
Lymphocytes Absolute: 1.5 10*3/uL (ref 0.7–3.1)
Lymphs: 22 %
MCH: 30 pg (ref 26.6–33.0)
MCHC: 33.9 g/dL (ref 31.5–35.7)
MCV: 88 fL (ref 79–97)
Monocytes Absolute: 0.5 10*3/uL (ref 0.1–0.9)
Monocytes: 7 %
Neutrophils Absolute: 4.5 10*3/uL (ref 1.4–7.0)
Neutrophils: 68 %
Platelets: 244 10*3/uL (ref 150–450)
RBC: 4.14 x10E6/uL (ref 3.77–5.28)
RDW: 13.1 % (ref 11.7–15.4)
RPR Ser Ql: NONREACTIVE
Rh Factor: POSITIVE
Rubella Antibodies, IGG: 1.2 index (ref >0.99)
WBC: 6.7 10*3/uL (ref 3.4–10.8)

## 2020-09-14 LAB — HEMOGLOBIN A1C
Est. average glucose Bld gHb Est-mCnc: 123 mg/dL
Hgb A1c MFr Bld: 5.9 % — ABNORMAL HIGH (ref 4.8–5.6)

## 2020-09-14 LAB — AB SCR+ANTIBODY ID: Antibody Screen: POSITIVE — AB

## 2020-09-14 LAB — PROTEIN / CREATININE RATIO, URINE
Creatinine, Urine: 260 mg/dL
Protein, Ur: 18.7 mg/dL
Protein/Creat Ratio: 72 mg/g creat (ref 0–200)

## 2020-09-14 LAB — CYTOLOGY - PAP
Comment: NEGATIVE
Diagnosis: UNDETERMINED — AB
High risk HPV: NEGATIVE

## 2020-09-14 LAB — HEPATITIS C ANTIBODY: Hep C Virus Ab: <0.1 s/co ratio (ref 0.0–0.9)

## 2020-09-14 MED ORDER — VITAFOL GUMMIES 3.33-0.333-34.8 MG PO CHEW: 1.0000 | CHEWABLE_TABLET | Freq: Every day | ORAL | 11 refills | Status: DC

## 2020-09-14 NOTE — Progress Notes (Signed)
PNV's previously sent to [facility].  Pt requests PNV gummies to be sent to [facility]

## 2020-09-15 DIAGNOSIS — O99891 Other specified diseases and conditions complicating pregnancy: Secondary | ICD-10-CM | POA: Insufficient documentation

## 2020-09-15 DIAGNOSIS — O361990 Maternal care for other isoimmunization, unspecified trimester, not applicable or unspecified: Secondary | ICD-10-CM | POA: Insufficient documentation

## 2020-09-15 DIAGNOSIS — R8271 Bacteriuria: Secondary | ICD-10-CM | POA: Insufficient documentation

## 2020-09-15 LAB — CULTURE, OB URINE

## 2020-09-15 LAB — URINE CULTURE, OB REFLEX

## 2020-09-15 MED ORDER — CEFADROXIL 500 MG PO CAPS: 500.0000 mg | ORAL_CAPSULE | Freq: Two times a day (BID) | ORAL | 0 refills | Status: AC

## 2020-09-15 NOTE — Addendum Note (Signed)
Addended by: Marylen Ponto on: 09/15/2020 08:58 PM   Modules accepted: Orders

## 2020-09-19 ENCOUNTER — Encounter: Payer: Self-pay | Admitting: Women's Health

## 2020-09-25 ENCOUNTER — Encounter: Payer: Self-pay | Admitting: Women's Health

## 2020-10-10 ENCOUNTER — Ambulatory Visit (INDEPENDENT_AMBULATORY_CARE_PROVIDER_SITE_OTHER): Payer: Medicaid Other | Admitting: Women's Health

## 2020-10-10 ENCOUNTER — Other Ambulatory Visit: Payer: Self-pay | Admitting: Obstetrics and Gynecology

## 2020-10-10 ENCOUNTER — Other Ambulatory Visit: Payer: Self-pay

## 2020-10-10 ENCOUNTER — Encounter: Payer: Self-pay | Admitting: Women's Health

## 2020-10-10 VITALS — BP 119/81 | HR 73 | Wt 312.0 lb

## 2020-10-10 DIAGNOSIS — O99891 Other specified diseases and conditions complicating pregnancy: Secondary | ICD-10-CM

## 2020-10-10 DIAGNOSIS — F1290 Cannabis use, unspecified, uncomplicated: Secondary | ICD-10-CM

## 2020-10-10 DIAGNOSIS — O361991 Maternal care for other isoimmunization, unspecified trimester, fetus 1: Secondary | ICD-10-CM

## 2020-10-10 DIAGNOSIS — Z148 Genetic carrier of other disease: Secondary | ICD-10-CM | POA: Insufficient documentation

## 2020-10-10 DIAGNOSIS — Z975 Presence of (intrauterine) contraceptive device: Secondary | ICD-10-CM

## 2020-10-10 DIAGNOSIS — O99210 Obesity complicating pregnancy, unspecified trimester: Secondary | ICD-10-CM

## 2020-10-10 DIAGNOSIS — R8271 Bacteriuria: Secondary | ICD-10-CM

## 2020-10-10 DIAGNOSIS — R7303 Prediabetes: Secondary | ICD-10-CM

## 2020-10-10 DIAGNOSIS — Z3481 Encounter for supervision of other normal pregnancy, first trimester: Secondary | ICD-10-CM

## 2020-10-10 DIAGNOSIS — R011 Cardiac murmur, unspecified: Secondary | ICD-10-CM

## 2020-10-10 DIAGNOSIS — Z3A16 16 weeks gestation of pregnancy: Secondary | ICD-10-CM

## 2020-10-10 NOTE — Progress Notes (Signed)
Subjective:  Mckenzie Miller is a 35 y.o. G3P2002 at [redacted]w[redacted]d being seen today for ongoing prenatal care.  She is currently monitored for the following issues for this low-risk pregnancy and has Pneumonia due to COVID-19 virus; Prediabetes; Encounter for supervision of normal pregnancy in first trimester; Obesity in pregnancy; Chronic pain of right knee; Marijuana use; Nexplanon in place; Heart murmur of newborn; Lewis isoimmunization during pregnancy; Asymptomatic bacteriuria during pregnancy; and Genetic carrier on their problem list.  Patient reports carpal tunnel symptoms.  Contractions: Not present. Vag. Bleeding: None.   . Denies leaking of fluid.   The following portions of the patient's history were reviewed and updated as appropriate: allergies, current medications, past family history, past medical history, past social history, past surgical history and problem list. Problem list updated.  Objective:   Vitals:   10/10/20 0907  BP: 119/81  Pulse: 73  Weight: (!) 312 lb (141.5 kg)    Fetal Status: Fetal Heart Rate (bpm): 140         General:  Alert, oriented and cooperative. Patient is in no acute distress.  Skin: Skin is warm and dry. No rash noted.   Cardiovascular: Normal heart rate noted  Respiratory: Normal respiratory effort, no problems with respiration noted  Abdomen: Soft, gravid, appropriate for gestational age. Pain/Pressure: Absent     Pelvic: Vag. Bleeding: None     Cervical exam deferred        Extremities: Normal range of motion.  Edema: None  Mental Status: Normal mood and affect. Normal behavior. Normal judgment and thought content.   Urinalysis:      Assessment and Plan:  Pregnancy: G3P2002 at [redacted]w[redacted]d  1. Encounter for supervision of other normal pregnancy in first trimester -AFP today -considering BTL  2. Prediabetes -A1C 5.9 at NOB, needs early GTT, pt previously informed but did not schedule, pt reports she contacted out office several times, but was  not able to get through to schedule appt, will schedule today  3. Heart murmur of newborn -reports daughter born in 2010 born with heart murmur that resolved, denies structural heart defects in child  4. Nexplanon in place - x12 years - able to be palpated easily at ends of Nexplanon, body of Nexplanon palpable on deep palpation - pt elects to wait until after delivery for removal of Nexplanon  5. Marijuana use -pt reports no additional marijuana use  6. Obesity in pregnancy -on low dose ASA  7. Asymptomatic bacteriuria during pregnancy - pt confirms she took ABX as prescribed - Urine Culture for TOC today  8. 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 It  has not been reported as a cause of HDN.  9. [redacted] weeks gestation of pregnancy  10. 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 I discussed the assessment and treatment plan with the patient. The patient was provided an opportunity to ask questions and all were answered. The patient agreed with the plan and demonstrated an understanding of the instructions. The patient was advised to call back or seek an in-person office evaluation/go to MAU at [facility] for any urgent or concerning symptoms. Please refer to After Visit Summary for other counseling recommendations.  Return  in about 4 weeks (around 11/07/2020) for in-person HOB/APP OK, needs early glucose test ASAP.   Lorey Pallett, Odie Sera, NP

## 2020-10-10 NOTE — Progress Notes (Signed)
Pt reports numbness and tingling in both hands. Pt needs to schedule fasting glucose. AFP due today

## 2020-10-10 NOTE — Patient Instructions (Signed)
Maternity Assessment Unit (MAU)  The Maternity Assessment Unit (MAU) is located at the [facility] at [facility]. The address is: [address], [address], [place], [place] [address]. Please see map below for additional directions.    The Maternity Assessment Unit is designed to help you during your pregnancy, and for up to 6 weeks after delivery, with any pregnancy- or postpartum-related emergencies, if you think you are in labor, or if your water has broken. For example, if you experience nausea and vomiting, vaginal bleeding, severe abdominal or pelvic pain, elevated blood pressure or other problems related to your pregnancy or postpartum time, please come to the Maternity Assessment Unit for assistance.        

## 2020-10-11 ENCOUNTER — Ambulatory Visit: Payer: Self-pay | Admitting: Nurse Practitioner

## 2020-10-12 ENCOUNTER — Other Ambulatory Visit: Payer: Self-pay

## 2020-10-12 ENCOUNTER — Other Ambulatory Visit: Payer: Medicaid Other

## 2020-10-12 DIAGNOSIS — Z3481 Encounter for supervision of other normal pregnancy, first trimester: Secondary | ICD-10-CM

## 2020-10-12 LAB — AFP, SERUM, OPEN SPINA BIFIDA
AFP MoM: 1.11
AFP Value: 30.3 ng/mL
Gest. Age on Collection Date: 16.6 weeks
Maternal Age At EDD: 34.7 yr
OSBR Risk 1 IN: 10000
Test Results:: NEGATIVE
Weight: 312 [lb_av]

## 2020-10-12 NOTE — Progress Notes (Unsigned)
tt

## 2020-10-13 LAB — GLUCOSE TOLERANCE, 2 HOURS W/ 1HR
Glucose, 1 hour: 117 mg/dL (ref 65–179)
Glucose, 2 hour: 61 mg/dL — ABNORMAL LOW (ref 65–152)
Glucose, Fasting: 87 mg/dL (ref 65–91)

## 2020-10-13 LAB — URINE CULTURE

## 2020-10-27 ENCOUNTER — Other Ambulatory Visit: Payer: Self-pay

## 2020-10-27 ENCOUNTER — Inpatient Hospital Stay (HOSPITAL_COMMUNITY)
Admission: AD | Admit: 2020-10-27 | Discharge: 2020-10-27 | Disposition: A | Discharge status: Home / Self Care | Payer: Medicaid Other | Attending: Obstetrics & Gynecology | Admitting: Obstetrics & Gynecology

## 2020-10-27 ENCOUNTER — Encounter (HOSPITAL_COMMUNITY): Payer: Self-pay | Admitting: Obstetrics & Gynecology

## 2020-10-27 DIAGNOSIS — Z3A19 19 weeks gestation of pregnancy: Secondary | ICD-10-CM

## 2020-10-27 DIAGNOSIS — Z882 Allergy status to sulfonamides status: Secondary | ICD-10-CM | POA: Diagnosis not present

## 2020-10-27 DIAGNOSIS — Z87891 Personal history of nicotine dependence: Secondary | ICD-10-CM | POA: Insufficient documentation

## 2020-10-27 DIAGNOSIS — O98812 Other maternal infectious and parasitic diseases complicating pregnancy, second trimester: Secondary | ICD-10-CM | POA: Diagnosis not present

## 2020-10-27 DIAGNOSIS — R011 Cardiac murmur, unspecified: Secondary | ICD-10-CM

## 2020-10-27 DIAGNOSIS — B372 Candidiasis of skin and nail: Secondary | ICD-10-CM

## 2020-10-27 DIAGNOSIS — B373 Candidiasis of vulva and vagina: Secondary | ICD-10-CM | POA: Diagnosis not present

## 2020-10-27 DIAGNOSIS — R109 Unspecified abdominal pain: Secondary | ICD-10-CM | POA: Diagnosis present

## 2020-10-27 DIAGNOSIS — Z148 Genetic carrier of other disease: Secondary | ICD-10-CM

## 2020-10-27 DIAGNOSIS — Z7982 Long term (current) use of aspirin: Secondary | ICD-10-CM | POA: Insufficient documentation

## 2020-10-27 DIAGNOSIS — F1290 Cannabis use, unspecified, uncomplicated: Secondary | ICD-10-CM

## 2020-10-27 DIAGNOSIS — Z975 Presence of (intrauterine) contraceptive device: Secondary | ICD-10-CM

## 2020-10-27 DIAGNOSIS — R8271 Bacteriuria: Secondary | ICD-10-CM

## 2020-10-27 LAB — URINALYSIS, ROUTINE W REFLEX MICROSCOPIC
Bilirubin Urine: NEGATIVE
Glucose, UA: NEGATIVE mg/dL
Hgb urine dipstick: NEGATIVE
Ketones, ur: 5 mg/dL — AB
Nitrite: NEGATIVE
Protein, ur: NEGATIVE mg/dL
Specific Gravity, Urine: 1.028 (ref 1.005–1.030)
pH: 5 (ref 5.0–8.0)

## 2020-10-27 MED ORDER — TRIAMCINOLONE ACETONIDE 0.1 % EX CREA: 1.0000 "application " | TOPICAL_CREAM | Freq: Two times a day (BID) | CUTANEOUS | 0 refills | Status: DC

## 2020-10-27 MED ORDER — NYSTATIN 100000 UNIT/GM EX CREA: TOPICAL_CREAM | CUTANEOUS | 0 refills | Status: DC

## 2020-10-27 NOTE — MAU Provider Note (Signed)
History     CSN: [id number]  Arrival date and time: 10/27/20 2212   Event Date/Time   First Provider Initiated Contact with Patient 10/27/20 2303      Chief Complaint  Patient presents with   Abdominal Pain   HPI Mckenzie Miller is a 35 y.o. G3P2002 at 70w0dwho presents to MAU today with complaint of vaginal irritation and occasional pink/red noted with wiping. The patient denies abnormal discharge or itching. She states she has pain of the external vaginal area on the labia, especially proximal to the perineum since yesterday. She has seen pink and sometimes red on the tissue with wiping and wiping is painful. She states pain with walking and sitting as well. She has been swimming weekly lately. She is unsure if the blood is external or vaginal. She denies lower abdominal pain. She has not tried anything to help with the vaginal pain yet. She denies UTI symptoms.    OB History     Gravida  3   Para  2   Term  2   Preterm      AB      Living  2      SAB      IAB      Ectopic      Multiple      Live Births  2           History reviewed. No pertinent past medical history.  Past Surgical History:  Procedure Laterality Date   KNEE SURGERY      Family History  Problem Relation Age of Onset   Diabetes Mellitus II Mother    Kidney failure Mother    Breast cancer Mother 567   Social History   Tobacco Use   Smoking status: Former    Packs/day: 0.25    Pack years: 0.00    Types: Cigarettes    Quit date: 01/16/2020    Years since quitting: 0.7   Smokeless tobacco: Never  Vaping Use   Vaping Use: Never used  Substance Use Topics   Alcohol use: Not Currently    Comment: Not since confirmed pregnancy   Drug use: Yes    Types: Marijuana    Comment: 1 x a week    Allergies:  Allergies  Allergen Reactions   Sulfa Antibiotics Hives   Sulfites Hives    Unsure which one, allergic     Medications Prior to Admission  Medication Sig Dispense  Refill Last Dose   acetaminophen (TYLENOL) 325 MG tablet Take 650 mg by mouth every 6 (six) hours as needed for mild pain or headache.    Past Week   aspirin EC 81 MG tablet Take 1 tablet (81 mg total) by mouth daily. Swallow whole. 30 tablet 6 10/27/2020   Prenatal Vit-Fe Phos-FA-Omega (VITAFOL GUMMIES) 3.33-0.333-34.8 MG CHEW Chew 1 tablet by mouth daily. 90 tablet 11 10/27/2020   Ascorbic Acid (VITAMIN C) 1000 MG tablet Take 1,000 mg by mouth daily. (Patient not taking: No sig reported)      Blood Glucose Monitoring Suppl (TRUE METRIX METER) w/Device KIT 1 kit by Does not apply route in the morning and at bedtime. (Patient not taking: No sig reported) 1 kit 0    Blood Pressure Monitoring (BLOOD PRESSURE KIT) DEVI 1 kit by Does not apply route once a week. 1 each 0    Cholecalciferol (VITAMIN D-3) 25 MCG (1000 UT) CAPS Take by mouth. (Patient not taking: No sig reported)  glucose blood (TRUE METRIX PRO BLOOD GLUCOSE) test strip Use as instructed (Patient not taking: No sig reported) 100 each 12    Prenatal Vit-Fe Fumarate-FA (MULTIVITAMIN-PRENATAL) 27-0.8 MG TABS tablet Take 1 tablet by mouth daily at 12 noon. (Patient not taking: No sig reported)      zinc gluconate 50 MG tablet Take 50 mg by mouth daily. (Patient not taking: No sig reported)       Review of Systems  Constitutional:  Negative for fever.  Gastrointestinal:  Negative for abdominal pain.  Genitourinary:  Positive for vaginal bleeding, vaginal discharge and vaginal pain. Negative for dysuria, genital sores and pelvic pain.  Physical Exam   Blood pressure 137/81, pulse 88, temperature 98.5 F (36.9 C), resp. rate 18, height 5' 5" (1.651 m), weight (!) 146.1 kg, last menstrual period 06/16/2020.  Physical Exam Vitals and nursing note reviewed. Exam conducted with a chaperone present.  Constitutional:      General: She is not in acute distress.    Appearance: She is well-developed. She is obese. She is not ill-appearing.   Cardiovascular:     Rate and Rhythm: Normal rate.  Pulmonary:     Effort: Pulmonary effort is normal.  Abdominal:     General: Abdomen is flat. There is no distension.  Genitourinary:    Labia:        Right: Rash present.        Left: Rash (mild erythema without ulceration, or bleeding) present.      Vagina: Vaginal discharge (scant, thin, white, non-odorous) present.     Cervix: Normal.     Comments: Cervix is visually closed, normal discharge without bleeding or pooling noted Neurological:     Mental Status: She is alert.     MAU Course  Procedures None  MDM Cervix visually closed, no vaginal bleeding noted. Erythema of the labia noted bilaterally likely causing the pain and occasional bleeding with wiping. Exam is consistent with yeast.   Assessment and Plan  A: SIUP at 51w0dYeast infection of the skin, vaginal area   P: Discharge home Rx for Nystatin and Triamcinolone sent to patient's pharmacy  Second trimester precautions discussed Patient advised to follow-up with [facility] as scheduled for routine prenatal care. Also has MFM appointment for anatomy [place] [place].  Patient may return to MAU as needed or if her condition were to change or worsen   JKerry Hough PA-C 10/27/2020, 11:25 PM

## 2020-10-27 NOTE — MAU Note (Signed)
For a couple of days has noticed discomfort in vaginal area. Yesterday noticed red/pink tinge on toilet paper when wiping on occ. Does not happen everytime. Does not know if it is coming from vagina or if area is irritated "down there". No unusual vag d/c

## 2020-10-30 ENCOUNTER — Other Ambulatory Visit: Payer: Self-pay | Admitting: *Deleted

## 2020-10-30 ENCOUNTER — Other Ambulatory Visit: Payer: Self-pay

## 2020-10-30 ENCOUNTER — Ambulatory Visit (HOSPITAL_BASED_OUTPATIENT_CLINIC_OR_DEPARTMENT_OTHER): Payer: Medicaid Other

## 2020-10-30 ENCOUNTER — Ambulatory Visit: Payer: Medicaid Other | Attending: Women's Health

## 2020-10-30 ENCOUNTER — Ambulatory Visit: Payer: Medicaid Other | Admitting: *Deleted

## 2020-10-30 VITALS — BP 144/91 | HR 81

## 2020-10-30 DIAGNOSIS — Z315 Encounter for genetic counseling: Secondary | ICD-10-CM | POA: Diagnosis present

## 2020-10-30 DIAGNOSIS — Z148 Genetic carrier of other disease: Secondary | ICD-10-CM

## 2020-10-30 DIAGNOSIS — Z3481 Encounter for supervision of other normal pregnancy, first trimester: Secondary | ICD-10-CM

## 2020-10-30 DIAGNOSIS — F1290 Cannabis use, unspecified, uncomplicated: Secondary | ICD-10-CM | POA: Diagnosis present

## 2020-10-30 DIAGNOSIS — O99212 Obesity complicating pregnancy, second trimester: Secondary | ICD-10-CM

## 2020-10-30 DIAGNOSIS — Z975 Presence of (intrauterine) contraceptive device: Secondary | ICD-10-CM

## 2020-10-30 DIAGNOSIS — Z3A19 19 weeks gestation of pregnancy: Secondary | ICD-10-CM

## 2020-10-30 DIAGNOSIS — O99891 Other specified diseases and conditions complicating pregnancy: Secondary | ICD-10-CM | POA: Insufficient documentation

## 2020-10-30 DIAGNOSIS — Z363 Encounter for antenatal screening for malformations: Secondary | ICD-10-CM | POA: Diagnosis present

## 2020-10-30 DIAGNOSIS — R8271 Bacteriuria: Secondary | ICD-10-CM | POA: Diagnosis present

## 2020-10-30 DIAGNOSIS — R011 Cardiac murmur, unspecified: Secondary | ICD-10-CM | POA: Insufficient documentation

## 2020-10-30 DIAGNOSIS — Z6841 Body Mass Index (BMI) 40.0 and over, adult: Secondary | ICD-10-CM

## 2020-10-30 NOTE — Progress Notes (Signed)
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  We reviewed those results as well as additional screening and testing options for this pregnancy. She was present at this visit with her sister.  SMA is a recessive genetic condition with variable age of onset and severity caused by mutations in the SMN1 gene.  To review, a recessive condition occurs in a child when both the mother and the father are carriers of the genetic condition and both pass on that altered gene to the child.  The features of SMA are caused by the loss of motor neurons which leads to progressive muscle weakness and muscle wasting (atrophy) in infancy.  There are several types of SMA based upon severity, but in the most common type (type 1), children may pass away as early as 41 years of age.  Carrier testing assesses the number of copies of the SMN1 gene, from zero to four.  Persons with one copy of the SMN1 gene are carriers, and those with no copies are affected with the condition.  Individuals with two or more copies have a reduced chance to be a carrier.  Persons with 3 or 4 copies of this gene are highly unlikely to be carriers.  However, someone with two copies of the gene can either have two copies of this gene on separate chromosomes and thus not be a carrier or they could have two copies of this gene on the same chromosome with no copies on the other chromosome and be a "silent" carrier.  This silent carrier status is known to be more common in persons of African American or Ashkenazi Jewish ancestry, particularly when they are found to be positive for a variant in the gene known as c. *3+80T>G. 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  This testing cannot confirm or eliminate the chance to have a child with SMA. It is estimated that current carrier screening can detect 94.8% of carriers in the Caucasian population and 70.5% of carriers in the African American population.    Then next option we reviewed is to have her partner tested to determine his status.  If he is found to be a carrier, then the pregnancy may be at increased risk for SMA and testing would be offered during the pregnancy through amniocentesis or at the time of birth. At this time, prior to testing, her partner has a chance of 1 in 73 to be a carrier given his African American ancestry and no known family history of SMA.  Overall, the chance for this couple to have a child with SMA at this time is 1/72 X 1/34 X = 1 in 9,792. If he were to have testing that showed a low chance for him to be a carrier, this number could be decreased significantly.  If his testing were to show that he is a carrier, then the number would be increased.   We also talked about the possibility of testing at the time of delivery. Beginning in 2021, all newborn babies in [place] are test for SMA as well as numerous other conditions as part of state mandated newborn screening. Testing on cord blood or of the baby  sometime after delivery could also be ordered if desired.  Lastly, we reviewed the results of the other testing performed by her OB for screening.  The results of the CF and hemoglobinopathy screening were negative, thus greatly reducing the chance to have a child with either of these conditions.  Panorama testing was ordered by [facility] and was negative.  We also obtained a detailed family history and pregnancy history.  This is the third pregnancy for this patient, the first with her current partner.  She has two heathy children.  In the family history, the patient reported one maternal half brother with mild developmental differences.  He had possible trauma at delivery which may  have contributed to his condition. She also reported a paternal half sister with developmental delay, though no information is known about the cause for her condition.  There may be many reasons for developmental differences including inherited conditions such as Fragile X syndrome as well as environmental factors.  The patient declined carrier screening for Fragile X syndrome. She also reported a heart murmur in her last child which resolved on it's own.  Because this resolved without intervention, we would expect a low chance for a heart defect in this pregnancy.  Lastly, she reported that her mother was diagnosed with breast cancer around 34 year old, as was her mother's sister.  We reviewed that most cases of cancer occur by chance, but that there may be genetic factors in some families which increase the risk, particularly when the cancer occurs at young ages or in multiple family members.  If she is concerned about this history we are happy to provide contact information to speak with a cancer genetic counselor. The remainder of the family history was unremarkable for birth defects, intellectual disabilities, muscle weakness conditions, early death or known genetic conditions. In the current pregnancy, the patient reported no complications or exposures in this pregnancy that would be expected to increase the risk for birth defects.  Thank you for allowing [place] to be involved in the care of this patient.  We encouraged her to call with any questions or concerns as they arise.  We may be reached at [phone].  Plan of care:  Patient declined testing on FOB as he is not currently available for testing. Therefore, the patient plans to follow up with newborn screening results at the time of delivery. Mckenzie Miller declined Fragile X carrier screening which was offered due to the family history of developmental delays.  Cherly Anderson, MS, CGC

## 2020-11-07 ENCOUNTER — Other Ambulatory Visit: Payer: Self-pay

## 2020-11-07 ENCOUNTER — Ambulatory Visit (INDEPENDENT_AMBULATORY_CARE_PROVIDER_SITE_OTHER): Payer: Medicaid Other | Admitting: Family Medicine

## 2020-11-07 ENCOUNTER — Encounter: Payer: Self-pay | Admitting: Family Medicine

## 2020-11-07 VITALS — BP 126/82 | HR 91 | Wt 315.0 lb

## 2020-11-07 DIAGNOSIS — O99210 Obesity complicating pregnancy, unspecified trimester: Secondary | ICD-10-CM

## 2020-11-07 DIAGNOSIS — O99891 Other specified diseases and conditions complicating pregnancy: Secondary | ICD-10-CM

## 2020-11-07 DIAGNOSIS — Z975 Presence of (intrauterine) contraceptive device: Secondary | ICD-10-CM

## 2020-11-07 DIAGNOSIS — R8271 Bacteriuria: Secondary | ICD-10-CM

## 2020-11-07 DIAGNOSIS — R7303 Prediabetes: Secondary | ICD-10-CM

## 2020-11-07 DIAGNOSIS — O361920 Maternal care for other isoimmunization, second trimester, not applicable or unspecified: Secondary | ICD-10-CM

## 2020-11-07 DIAGNOSIS — Z3481 Encounter for supervision of other normal pregnancy, first trimester: Secondary | ICD-10-CM

## 2020-11-07 NOTE — Patient Instructions (Signed)

## 2020-11-07 NOTE — Progress Notes (Signed)
   Subjective:  Mckenzie Miller is a 35 y.o. G3P2002 at [redacted]w[redacted]d being seen today for ongoing prenatal care.  She is currently monitored for the following issues for this low-risk pregnancy and has Pneumonia due to COVID-19 virus; Prediabetes; Encounter for supervision of normal pregnancy in first trimester; Obesity in pregnancy; Chronic pain of right knee; Marijuana use; Nexplanon in place; Heart murmur of newborn; Lewis isoimmunization during pregnancy; Asymptomatic bacteriuria during pregnancy; and Genetic carrier on their problem list.  Patient reports no complaints.  Contractions: Not present. Vag. Bleeding: None.  Movement: Present. Denies leaking of fluid.   The following portions of the patient's history were reviewed and updated as appropriate: allergies, current medications, past family history, past medical history, past social history, past surgical history and problem list. Problem list updated.  Objective:   Vitals:   11/07/20 0911  BP: 126/82  Pulse: 91  Weight: (!) 315 lb (142.9 kg)    Fetal Status: Fetal Heart Rate (bpm): 146   Movement: Present     General:  Alert, oriented and cooperative. Patient is in no acute distress.  Skin: Skin is warm and dry. No rash noted.   Cardiovascular: Normal heart rate noted  Respiratory: Normal respiratory effort, no problems with respiration noted  Abdomen: Soft, gravid, appropriate for gestational age. Pain/Pressure: Absent     Pelvic: Vag. Bleeding: None     Cervical exam deferred        Extremities: Normal range of motion.  Edema: Trace  Mental Status: Normal mood and affect. Normal behavior. Normal judgment and thought content.   Urinalysis:      Assessment and Plan:  Pregnancy: G3P2002 at [redacted]w[redacted]d  1. Encounter for supervision of other normal pregnancy in first trimester BP and FHR normal Anatomy scan normal but incomplete, follow up scheduled next month Discussed contraception, still weighing her options and thinking about a  BTL  2. Prediabetes Early 2hr gtt normal  3. Obesity in pregnancy   4. Nexplanon in place X12 years, remove after delivery  5. Lewis isoimmunization during pregnancy in second trimester, single or unspecified fetus Not clinically significant  6. Asymptomatic bacteriuria during pregnancy TOC negative last visit  Preterm labor symptoms and general obstetric precautions including but not limited to vaginal bleeding, contractions, leaking of fluid and fetal movement were reviewed in detail with the patient. Please refer to After Visit Summary for other counseling recommendations.  Return in 4 weeks (on 12/05/2020) for ob visit.   Venora Maples, MD

## 2020-11-07 NOTE — Progress Notes (Signed)
+   Fetal movement. Pt c/o bilateral numbness and tingling in hands.

## 2020-11-09 ENCOUNTER — Ambulatory Visit: Payer: Medicaid Other | Admitting: Physical Therapy

## 2020-11-14 ENCOUNTER — Ambulatory Visit: Payer: Self-pay

## 2020-11-16 ENCOUNTER — Other Ambulatory Visit: Payer: Self-pay

## 2020-11-16 ENCOUNTER — Encounter: Payer: Self-pay | Admitting: Physical Therapy

## 2020-11-16 ENCOUNTER — Ambulatory Visit: Payer: Medicaid Other | Attending: Women's Health | Admitting: Physical Therapy

## 2020-11-16 DIAGNOSIS — M6281 Muscle weakness (generalized): Secondary | ICD-10-CM | POA: Insufficient documentation

## 2020-11-16 DIAGNOSIS — G8929 Other chronic pain: Secondary | ICD-10-CM | POA: Insufficient documentation

## 2020-11-16 DIAGNOSIS — M25561 Pain in right knee: Secondary | ICD-10-CM | POA: Insufficient documentation

## 2020-11-16 NOTE — Patient Instructions (Signed)
Access Code: [email or web address] URL: [email or web address] Date: 11/16/2020 Prepared by: Lavinia Sharps  Exercises Sidelying Hip Abduction (Mirrored) - 1 x daily - 7 x weekly - 1 sets - 10 reps Clamshell (Mirrored) - 1 x daily - 7 x weekly - 1 sets - 10 reps Sit to Stand Without Arm Support - 3 x daily - 7 x weekly - 1 sets - 5 reps

## 2020-11-16 NOTE — Therapy (Addendum)
[facility] [address] [place], [place], [address] Phone: [phone]   Fax:  [phone]  Physical Therapy Evaluation/Discharge Summary   Patient Details  Name: Mckenzie Miller MRN: [id number] Date of Birth: 26-Jul-1985 Referring Provider (PT): Vernice Jefferson NP   Encounter Date: 11/16/2020   PT End of Session - 11/16/20 1024     Visit Number 1    Date for PT Re-Evaluation 02/08/21    Authorization Type Medicaid [employer] will submit    PT Start Time 0847    PT Stop Time 0930    PT Time Calculation (min) 43 min    Activity Tolerance Patient tolerated treatment well             Past Medical History:  Diagnosis Date   Anemia    Arthritis    Headache    Pneumonia    Vaginal Pap smear, abnormal     Past Surgical History:  Procedure Laterality Date   KNEE SURGERY      There were no vitals filed for this visit.    Subjective Assessment - 11/16/20 0853     Subjective Arthroscopic Knee surgery on cartilage in 2000 but retains fluid and hurts.  Hurts when it rains or is cold.  Sometimes I can barely walk on it.  No change while pregnant.    Pertinent History pregnant due in November    Limitations Walking;House hold activities;Standing    How long can you walk comfortably? 1 mile; 12 work shift, grocery shopping    Patient Stated Goals alleviate pain, strengthen knee; help the fluid  down    Currently in Pain? No/denies    Pain Score 0-No pain    Pain Location Knee    Pain Orientation Right;Anterior;Medial    Pain Type Chronic pain    Pain Radiating Towards medial knee swelling    Pain Onset More than a month ago    Pain Frequency Intermittent    Aggravating Factors  up and down stairs, walking, standing;  sometimes if stretched out too long lying down    Pain Relieving Factors alternate ice/heat sometimes helps; Bengay rubs                [facility] PT Assessment - 11/16/20 0001        Assessment   Medical Diagnosis chronic pain right knee    Referring Provider (PT) Vernice Jefferson NP    Onset Date/Surgical Date --   1 year   Next MD Visit Aug 11th    Prior Therapy don't think I had PT after surgery (only in 8th grade)      Precautions   Precaution Comments pregnant      Restrictions   Weight Bearing Restrictions No      Balance Screen   Has the patient fallen in the past 6 months No    Has the patient had a decrease in activity level because of a fear of falling?  No    Is the patient reluctant to leave their home because of a fear of falling?  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    Leisure swim, write, read      Observation/Other Assessments   Focus on Therapeutic Outcomes (FOTO)  72%      Posture/Postural Control   Posture Comments bil knee hyperextension in standing      AROM   Overall AROM Comments knee ROM WFLS;  able to stoop to pick up something from floor      Strength   Overall Strength Comments able to rise sit to stand without UE assist from standard chair but produces right anterior knee pain    Right Hip Extension 4+/5    Right Hip ABduction 4+/5    Right Knee Flexion 5/5    Right Knee Extension 4/5                        Objective measurements completed on examination: See above findings.               PT Education - 11/16/20 1023     Education Details sidelying hip abuction, clams; sit to stand from high chair, benefits of aquatic ex;  benefits of strengthening muscles at knee and hip; changes with pregnancy; avoiding knee hyperextension    Person(s) Educated Patient    Methods Explanation;Demonstration;Handout               PT Short Term Goals - 11/16/20 1117       PT SHORT TERM GOAL #1   Title STGS=LTGs               PT Long Term Goals - 11/16/20 1117       PT LONG TERM GOAL #1   Title The patient will be instructed in a land and water HEP for improved knee pain, strength and health    Time 12    Period Weeks    Status New    Target Date 02/08/21      PT LONG TERM GOAL #2   Title The patient will have 5/5 strength in right hip and knee 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  She had knee arthroscopic surgery as an 8th grader and has had pain with walking or standing too long, up/down stairs, keep leg extended too long and when the weather is cold/rainy.  Some has some intermittent medial knee edema.  Bil genu recurvatum noted.  Full knee ROM but hyperextension noted.  Mild strength deficits with right hip abduction(pelvic drop noted with single leg stance) 4+/5.  Right quads 4+/5.  Anterior knee pain with rising from a standard height chair.  The patient would benefit from education on knee alignment considerations, right LE strengthening and benefits of aquatic ex for knee joint health especially during her pregnancy.    Personal Factors and Comorbidities Comorbidity 1    Comorbidities pregnant  Examination-Activity Limitations Locomotion Level;Squat;Stairs;Stand;Lift    Examination-Participation Restrictions Occupation;Community Activity    Stability/Clinical Decision Making Stable/Uncomplicated    Clinical Decision Making Low    Rehab Potential Good     PT Frequency 1x / week    PT Duration 12 weeks    PT Treatment/Interventions ADLs/Self Care Home Management;Aquatic Therapy;Therapeutic exercise;Therapeutic activities;Patient/family education;Manual techniques;Taping    PT Next Visit Plan aquatic PT and land based HEP instruction; pt to call back to schedule (works 12 hours shifts);  Fridays are the best days             Patient will benefit from skilled therapeutic intervention in order to improve the following deficits and impairments:  Difficulty walking, Pain, Decreased strength, Increased edema  Visit Diagnosis: Chronic pain of right knee - Plan: PT plan of care cert/re-cert  Muscle weakness (generalized) - Plan: PT plan of care cert/re-cert   PHYSICAL THERAPY DISCHARGE SUMMARY  Visits from Start of Care: 1  Current functional level related to goals / functional outcomes: The patient did not schedule further appts secondary to difficulty getting off work/short staffed.     Remaining deficits: As above   Education / Equipment: Basic HEP   Patient agrees to discharge. Patient goals were not met. Patient is being discharged due to not returning since the last visit.   Problem List Patient Active Problem List   Diagnosis Date Noted   Genetic carrier 10/10/2020   Lewis isoimmunization during pregnancy 09/15/2020   Asymptomatic bacteriuria during pregnancy 09/15/2020   Obesity in pregnancy 09/12/2020   Chronic pain of right knee 09/12/2020   Marijuana use 09/12/2020   Nexplanon in place 09/12/2020   Heart murmur of newborn 09/12/2020   Encounter for supervision of normal pregnancy in first trimester 09/06/2020   Prediabetes 02/06/2020   Pneumonia due to COVID-19 virus 01/22/2020   Ruben Im, PT 11/16/20 11:23 AM Phone: [phone] Fax: [phone]  Alvera Singh 11/16/2020, 11:23 AM  [facility] [address] [place], [place],  [address] Phone: [phone]   Fax:  [phone]  Name: Mckenzie Miller MRN: [id number] Date of Birth: Apr 13, 1986

## 2020-11-19 ENCOUNTER — Telehealth: Payer: Self-pay | Admitting: Physical Therapy

## 2020-11-19 NOTE — Telephone Encounter (Signed)
Called patient to scheule her new appts. Pt needs to speak with her job to see if she can get some dates/times off as they are short staffed. Pt will call back.   Ane Payment, PTA @TODAY @ 3:33 PM

## 2020-11-28 ENCOUNTER — Ambulatory Visit: Payer: Medicaid Other | Attending: Obstetrics

## 2020-11-28 ENCOUNTER — Ambulatory Visit: Payer: Medicaid Other | Admitting: *Deleted

## 2020-11-28 ENCOUNTER — Other Ambulatory Visit: Payer: Self-pay | Admitting: *Deleted

## 2020-11-28 ENCOUNTER — Other Ambulatory Visit: Payer: Self-pay

## 2020-11-28 ENCOUNTER — Encounter: Payer: Self-pay | Admitting: *Deleted

## 2020-11-28 VITALS — BP 118/69 | HR 83

## 2020-11-28 DIAGNOSIS — Z3481 Encounter for supervision of other normal pregnancy, first trimester: Secondary | ICD-10-CM

## 2020-11-28 DIAGNOSIS — O99212 Obesity complicating pregnancy, second trimester: Secondary | ICD-10-CM | POA: Diagnosis present

## 2020-11-28 DIAGNOSIS — Z362 Encounter for other antenatal screening follow-up: Secondary | ICD-10-CM | POA: Diagnosis not present

## 2020-11-28 DIAGNOSIS — R8271 Bacteriuria: Secondary | ICD-10-CM

## 2020-11-28 DIAGNOSIS — Z6841 Body Mass Index (BMI) 40.0 and over, adult: Secondary | ICD-10-CM

## 2020-11-28 DIAGNOSIS — O321XX0 Maternal care for breech presentation, not applicable or unspecified: Secondary | ICD-10-CM

## 2020-11-28 DIAGNOSIS — Z148 Genetic carrier of other disease: Secondary | ICD-10-CM | POA: Insufficient documentation

## 2020-11-28 DIAGNOSIS — Z3A23 23 weeks gestation of pregnancy: Secondary | ICD-10-CM

## 2020-11-28 DIAGNOSIS — F1290 Cannabis use, unspecified, uncomplicated: Secondary | ICD-10-CM

## 2020-11-28 DIAGNOSIS — Z975 Presence of (intrauterine) contraceptive device: Secondary | ICD-10-CM

## 2020-11-28 DIAGNOSIS — R011 Cardiac murmur, unspecified: Secondary | ICD-10-CM

## 2020-11-28 DIAGNOSIS — O99891 Other specified diseases and conditions complicating pregnancy: Secondary | ICD-10-CM

## 2020-11-28 DIAGNOSIS — O361920 Maternal care for other isoimmunization, second trimester, not applicable or unspecified: Secondary | ICD-10-CM

## 2020-12-06 ENCOUNTER — Other Ambulatory Visit: Payer: Self-pay

## 2020-12-06 ENCOUNTER — Ambulatory Visit (INDEPENDENT_AMBULATORY_CARE_PROVIDER_SITE_OTHER): Payer: Medicaid Other | Admitting: Obstetrics and Gynecology

## 2020-12-06 ENCOUNTER — Encounter: Payer: Self-pay | Admitting: Obstetrics and Gynecology

## 2020-12-06 VITALS — BP 134/88 | HR 86 | Wt 331.3 lb

## 2020-12-06 DIAGNOSIS — R011 Cardiac murmur, unspecified: Secondary | ICD-10-CM

## 2020-12-06 DIAGNOSIS — Z148 Genetic carrier of other disease: Secondary | ICD-10-CM

## 2020-12-06 DIAGNOSIS — Z3A24 24 weeks gestation of pregnancy: Secondary | ICD-10-CM

## 2020-12-06 DIAGNOSIS — O99891 Other specified diseases and conditions complicating pregnancy: Secondary | ICD-10-CM

## 2020-12-06 DIAGNOSIS — O99210 Obesity complicating pregnancy, unspecified trimester: Secondary | ICD-10-CM

## 2020-12-06 DIAGNOSIS — O361920 Maternal care for other isoimmunization, second trimester, not applicable or unspecified: Secondary | ICD-10-CM

## 2020-12-06 DIAGNOSIS — Z3481 Encounter for supervision of other normal pregnancy, first trimester: Secondary | ICD-10-CM

## 2020-12-06 DIAGNOSIS — Z975 Presence of (intrauterine) contraceptive device: Secondary | ICD-10-CM

## 2020-12-06 DIAGNOSIS — F1290 Cannabis use, unspecified, uncomplicated: Secondary | ICD-10-CM

## 2020-12-06 DIAGNOSIS — R8271 Bacteriuria: Secondary | ICD-10-CM

## 2020-12-06 NOTE — Progress Notes (Signed)
Patient presents for ROB. Patient has no concerns today. She has questions regarding GTT.

## 2020-12-06 NOTE — Progress Notes (Signed)
   PRENATAL VISIT NOTE  Subjective:  Mckenzie Miller is a 35 y.o. G3P2002 at 109w5d being seen today for ongoing prenatal care.  She is currently monitored for the following issues for this high-risk pregnancy and has Pneumonia due to COVID-19 virus; Prediabetes; Encounter for supervision of normal pregnancy in first trimester; Obesity in pregnancy; Chronic pain of right knee; Marijuana use; Nexplanon in place; Heart murmur of newborn; Lewis isoimmunization during pregnancy; Asymptomatic bacteriuria during pregnancy; and Genetic carrier on their problem list.  Patient reports  swelling in hands and feet .  Contractions: Not present. Vag. Bleeding: None.  Movement: Present. Denies leaking of fluid.   The following portions of the patient's history were reviewed and updated as appropriate: allergies, current medications, past family history, past medical history, past social history, past surgical history and problem list.   Objective:   Vitals:   12/06/20 0938  BP: 134/88  Pulse: 86  Weight: (!) 331 lb 4.8 oz (150.3 kg)    Fetal Status: Fetal Heart Rate (bpm): 144   Movement: Present     General:  Alert, oriented and cooperative. Patient is in no acute distress.  Skin: Skin is warm and dry. No rash noted.   Cardiovascular: Normal heart rate noted  Respiratory: Normal respiratory effort, no problems with respiration noted  Abdomen: Soft, gravid, appropriate for gestational age.  Pain/Pressure: Absent     Pelvic: Cervical exam deferred        Extremities: Normal range of motion.  Edema: Trace  Mental Status: Normal mood and affect. Normal behavior. Normal judgment and thought content.   Assessment and Plan:  Pregnancy: G3P2002 at [redacted]w[redacted]d  1. Lewis isoimmunization during pregnancy in second trimester, single or unspecified fetus No intervention  2. Asymptomatic bacteriuria during pregnancy  3. Marijuana use  4. Nexplanon in place Remove after pregnancy  5. Heart murmur of  newborn Previous pregnancy  6. Encounter for supervision of other normal pregnancy in first trimester Encouraged healthy eating habits and attempts to mitigate weight gain  7. Genetic carrier  8. Obesity in pregnancy  9. [redacted] weeks gestation of pregnancy   Preterm labor symptoms and general obstetric precautions including but not limited to vaginal bleeding, contractions, leaking of fluid and fetal movement were reviewed in detail with the patient. Please refer to After Visit Summary for other counseling recommendations.   Return for high OB, in person, 2 hr GTT, 3rd trim labs.  Future Appointments  Date Time Provider Department Center  01/02/2021  9:15 AM [facility] NURSE [facility]  01/02/2021  9:30 AM [facility] US3 [facility]    Conan Bowens, MD

## 2020-12-20 ENCOUNTER — Ambulatory Visit (INDEPENDENT_AMBULATORY_CARE_PROVIDER_SITE_OTHER): Payer: Medicaid Other | Admitting: Obstetrics and Gynecology

## 2020-12-20 ENCOUNTER — Telehealth: Payer: Self-pay

## 2020-12-20 ENCOUNTER — Other Ambulatory Visit: Payer: Medicaid Other

## 2020-12-20 ENCOUNTER — Other Ambulatory Visit: Payer: Self-pay

## 2020-12-20 VITALS — BP 128/84 | HR 91 | Wt 334.0 lb

## 2020-12-20 DIAGNOSIS — Z6841 Body Mass Index (BMI) 40.0 and over, adult: Secondary | ICD-10-CM

## 2020-12-20 DIAGNOSIS — O361920 Maternal care for other isoimmunization, second trimester, not applicable or unspecified: Secondary | ICD-10-CM

## 2020-12-20 DIAGNOSIS — Z23 Encounter for immunization: Secondary | ICD-10-CM

## 2020-12-20 DIAGNOSIS — O99210 Obesity complicating pregnancy, unspecified trimester: Secondary | ICD-10-CM

## 2020-12-20 DIAGNOSIS — Z148 Genetic carrier of other disease: Secondary | ICD-10-CM

## 2020-12-20 DIAGNOSIS — O0992 Supervision of high risk pregnancy, unspecified, second trimester: Secondary | ICD-10-CM

## 2020-12-20 DIAGNOSIS — Z3A26 26 weeks gestation of pregnancy: Secondary | ICD-10-CM | POA: Insufficient documentation

## 2020-12-20 DIAGNOSIS — Z3481 Encounter for supervision of other normal pregnancy, first trimester: Secondary | ICD-10-CM

## 2020-12-20 MED ORDER — VITAFOL GUMMIES 3.33-0.333-34.8 MG PO CHEW: 3.0000 | CHEWABLE_TABLET | Freq: Every day | ORAL | 11 refills | Status: AC

## 2020-12-20 NOTE — Addendum Note (Signed)
Addended by: Natale Milch D on: 12/20/2020 11:57 AM   Modules accepted: Orders

## 2020-12-20 NOTE — Progress Notes (Signed)
+   fetal movement. Pt states she does have fetal movement but does not feel like it is enough movement for this gestation.

## 2020-12-20 NOTE — Progress Notes (Addendum)
   PRENATAL VISIT NOTE  Subjective:  Mckenzie Miller is a 35 y.o. G3P2002 at [redacted]w[redacted]d being seen today for ongoing prenatal care.  She is currently monitored for the following issues for this high-risk pregnancy and has Pneumonia due to COVID-19 virus; Prediabetes; Encounter for supervision of high risk pregnancy in second trimester, antepartum; Obesity in pregnancy; Chronic pain of right knee; Marijuana use; Nexplanon in place; Heart murmur of newborn; Lewis isoimmunization during pregnancy; Asymptomatic bacteriuria during pregnancy; Genetic carrier; [redacted] weeks gestation of pregnancy; and BMI 50.0-59.9, adult (HCC) on their problem list.  Patient doing well with no acute concerns today. She reports  occasional lower pelvic pain  .  Contractions: Not present. Vag. Bleeding: None.  Movement: Present. Denies leaking of fluid.   The following portions of the patient's history were reviewed and updated as appropriate: allergies, current medications, past family history, past medical history, past social history, past surgical history and problem list. Problem list updated.  Objective:   Vitals:   12/20/20 0904  BP: 128/84  Pulse: 91  Weight: (!) 334 lb (151.5 kg)    Fetal Status: Fetal Heart Rate (bpm): 141 Fundal Height: 27 cm Movement: Present     General:  Alert, oriented and cooperative. Patient is in no acute distress.  Skin: Skin is warm and dry. No rash noted.   Cardiovascular: Normal heart rate noted  Respiratory: Normal respiratory effort, no problems with respiration noted  Abdomen: Soft, gravid, appropriate for gestational age.  Pain/Pressure: Absent     Pelvic: Cervical exam deferred        Extremities: Normal range of motion.  Edema: Trace  Mental Status:  Normal mood and affect. Normal behavior. Normal judgment and thought content.   Assessment and Plan:  Pregnancy: G3P2002 at [redacted]w[redacted]d  1. [redacted] weeks gestation of pregnancy   2. Obesity in pregnancy Weight currently stable  3.  BMI 50.0-59.9, adult (HCC)   4. Genetic carrier   5. Lewis isoimmunization during pregnancy in second trimester, single or unspecified fetus   6. Encounter for supervision of high risk pregnancy in second trimester, antepartum Continue routine care, follow up u/s 01/02/21 - Glucose Tolerance, 2 Hours w/1 Hour - CBC - RPR - HIV Antibody (routine testing w rflx) - Tdap vaccine greater than or equal to 7yo IM  Preterm labor symptoms and general obstetric precautions including but not limited to vaginal bleeding, contractions, leaking of fluid and fetal movement were reviewed in detail with the patient.  Please refer to After Visit Summary for other counseling recommendations.   Return in about 2 weeks (around 01/03/2021) for [facility], in person.   Mariel Aloe, MD Faculty Attending Center for [facility]

## 2020-12-20 NOTE — Telephone Encounter (Signed)
S/w pt and advised that [facility] paperwork is complete. Pt states that she will come by the office and sign it.

## 2020-12-21 LAB — CBC
Hematocrit: 33.8 % — ABNORMAL LOW (ref 34.0–46.6)
Hemoglobin: 11.4 g/dL (ref 11.1–15.9)
MCH: 29.7 pg (ref 26.6–33.0)
MCHC: 33.7 g/dL (ref 31.5–35.7)
MCV: 88 fL (ref 79–97)
Platelets: 228 10*3/uL (ref 150–450)
RBC: 3.84 x10E6/uL (ref 3.77–5.28)
RDW: 12.6 % (ref 11.7–15.4)
WBC: 8.3 10*3/uL (ref 3.4–10.8)

## 2020-12-21 LAB — GLUCOSE TOLERANCE, 2 HOURS W/ 1HR
Glucose, 1 hour: 134 mg/dL (ref 65–179)
Glucose, 2 hour: 83 mg/dL (ref 65–152)
Glucose, Fasting: 84 mg/dL (ref 65–91)

## 2020-12-21 LAB — HIV ANTIBODY (ROUTINE TESTING W REFLEX): HIV Screen 4th Generation wRfx: NONREACTIVE

## 2020-12-21 LAB — RPR: RPR Ser Ql: NONREACTIVE

## 2021-01-02 ENCOUNTER — Encounter: Payer: Self-pay | Admitting: *Deleted

## 2021-01-02 ENCOUNTER — Ambulatory Visit: Payer: Medicaid Other | Admitting: *Deleted

## 2021-01-02 ENCOUNTER — Ambulatory Visit: Payer: Medicaid Other | Attending: Maternal & Fetal Medicine

## 2021-01-02 ENCOUNTER — Other Ambulatory Visit: Payer: Self-pay

## 2021-01-02 ENCOUNTER — Other Ambulatory Visit: Payer: Self-pay | Admitting: *Deleted

## 2021-01-02 VITALS — BP 119/73 | HR 92

## 2021-01-02 DIAGNOSIS — O361930 Maternal care for other isoimmunization, third trimester, not applicable or unspecified: Secondary | ICD-10-CM

## 2021-01-02 DIAGNOSIS — Z3A28 28 weeks gestation of pregnancy: Secondary | ICD-10-CM | POA: Insufficient documentation

## 2021-01-02 DIAGNOSIS — Z362 Encounter for other antenatal screening follow-up: Secondary | ICD-10-CM | POA: Insufficient documentation

## 2021-01-02 DIAGNOSIS — Z148 Genetic carrier of other disease: Secondary | ICD-10-CM

## 2021-01-02 DIAGNOSIS — R8271 Bacteriuria: Secondary | ICD-10-CM

## 2021-01-02 DIAGNOSIS — O99213 Obesity complicating pregnancy, third trimester: Secondary | ICD-10-CM | POA: Diagnosis present

## 2021-01-02 DIAGNOSIS — F1290 Cannabis use, unspecified, uncomplicated: Secondary | ICD-10-CM

## 2021-01-02 DIAGNOSIS — O99891 Other specified diseases and conditions complicating pregnancy: Secondary | ICD-10-CM

## 2021-01-02 DIAGNOSIS — Z6841 Body Mass Index (BMI) 40.0 and over, adult: Secondary | ICD-10-CM

## 2021-01-02 DIAGNOSIS — E669 Obesity, unspecified: Secondary | ICD-10-CM

## 2021-01-02 DIAGNOSIS — R011 Cardiac murmur, unspecified: Secondary | ICD-10-CM

## 2021-01-02 DIAGNOSIS — O0992 Supervision of high risk pregnancy, unspecified, second trimester: Secondary | ICD-10-CM

## 2021-01-02 DIAGNOSIS — Z975 Presence of (intrauterine) contraceptive device: Secondary | ICD-10-CM

## 2021-01-04 ENCOUNTER — Other Ambulatory Visit: Payer: Self-pay

## 2021-01-04 ENCOUNTER — Encounter: Payer: Self-pay | Admitting: Family Medicine

## 2021-01-04 ENCOUNTER — Ambulatory Visit (INDEPENDENT_AMBULATORY_CARE_PROVIDER_SITE_OTHER): Payer: Medicaid Other | Admitting: Family Medicine

## 2021-01-04 VITALS — BP 122/81 | HR 85 | Wt 328.0 lb

## 2021-01-04 DIAGNOSIS — R8271 Bacteriuria: Secondary | ICD-10-CM

## 2021-01-04 DIAGNOSIS — O0992 Supervision of high risk pregnancy, unspecified, second trimester: Secondary | ICD-10-CM

## 2021-01-04 DIAGNOSIS — O99891 Other specified diseases and conditions complicating pregnancy: Secondary | ICD-10-CM

## 2021-01-04 DIAGNOSIS — Z6841 Body Mass Index (BMI) 40.0 and over, adult: Secondary | ICD-10-CM

## 2021-01-04 DIAGNOSIS — O361930 Maternal care for other isoimmunization, third trimester, not applicable or unspecified: Secondary | ICD-10-CM

## 2021-01-04 DIAGNOSIS — G5600 Carpal tunnel syndrome, unspecified upper limb: Secondary | ICD-10-CM

## 2021-01-04 DIAGNOSIS — O26899 Other specified pregnancy related conditions, unspecified trimester: Secondary | ICD-10-CM

## 2021-01-04 NOTE — Progress Notes (Signed)
PRENATAL VISIT NOTE  Subjective:  Mckenzie Miller is a 35 y.o. G3P2002 at [redacted]w[redacted]d being seen today for ongoing prenatal care.  She is currently monitored for the following issues for this high-risk pregnancy and has Pneumonia due to COVID-19 virus; Prediabetes; Encounter for supervision of high risk pregnancy in second trimester, antepartum; Obesity in pregnancy; Chronic pain of right knee; Marijuana use; Nexplanon in place; Heart murmur of newborn; Lewis isoimmunization during pregnancy; Asymptomatic bacteriuria during pregnancy; SMA carrier; [redacted] weeks gestation of pregnancy; and BMI 50.0-59.9, adult (HCC) on their problem list.  Patient reports carpal tunnel symptoms and a recent weight loss of 6 pounds. Her symptoms of carpal tunnel include tingling and numbness that have remained consistently on the tips of the left fingers only. Her symptoms improve occasionally with decreased usage of her hands, and they get worse sometimes at night. She experienced these same symptoms throughout her other pregnancies and they resolved with delivery. Her symptoms are not debilitating, but she wants to keep them on the radar. She has not tried braces before to control her symptoms.  Contractions: Not present. Vag. Bleeding: None.  Movement: Present. Denies leaking of fluid.   The following portions of the patient's history were reviewed and updated as appropriate: allergies, current medications, past family history, past medical history, past social history, past surgical history and problem list.   Objective:   Vitals:   01/04/21 0858  BP: 122/81  Pulse: 85  Weight: (!) 328 lb (148.8 kg)    Fetal Status: Fetal Heart Rate (bpm): 150   Movement: Present     General:  Alert, oriented and cooperative. Patient is in no acute distress.  Skin: Skin is warm and dry. No obvious rash noted.   Cardiovascular: Normal heart rate with regular rhythm, no murmurs, rubs, or gallops appreciated  Respiratory: Normal  respiratory effort, clear breath sounds throughout, no problems with respiration noted  Abdomen: Soft, gravid, fundal height 30 cm and appropriate for gestational age.  Pain/Pressure: Absent     Pelvic: Cervical exam deferred        Extremities: Grossly normal range of motion.    Mental Status: Normal mood and affect. Normal behavior. Normal judgment and thought content.   Assessment and Plan:  Pregnancy: G3P2002 at [redacted]w[redacted]d 1. Encounter for supervision of high risk pregnancy in second trimester, antepartum Patient overall doing well with no concerning features. Continue routine care with prenatal follow up in 2 weeks or sooner if concerns arise.  2. BMI 50.0-59.9, adult (HCC) Patient's noted weight loss of 6 pounds is normal in the setting of 48 pound weight gain over the pregnancy. Will observe.  3. Lewis isoimmunization during pregnancy in third trimester, single or unspecified fetus Antibody screen was positive for anti-Lewis a, which does not cross placenta or pose hemolytic risk to newborn.  4. Asymptomatic bacteriuria during pregnancy Patient's UA results on July 2nd, 2022 showed few bacteria, mod leukocytes, and 5 ketones. She continues to be asymptomatic today.  5. Carpal tunnel syndrome during pregnancy Patient's symptoms of numbness and tingling in her finger tips that arise in pregnancy and are relieved with reduction of wrist flexion are most consistent with carpal tunnel syndrome. Recommended using a brace from the pharmacy as much as possible throughout the day and always at night to reduce flexion and symptoms.  Please refer to After Visit Summary for other counseling recommendations.   Return in about 2 weeks (around 01/18/2021) for Routine prenatal care.  Future Appointments  Date  Time Provider Department Center  01/18/2021  8:55 AM Brock Bad, MD [facility] None  01/30/2021  8:30 AM [facility] NURSE [facility]  01/30/2021  8:45 AM [facility] US4 [facility]   Samantha Crimes, Medical Student  Center for [facility]

## 2021-01-04 NOTE — Progress Notes (Deleted)
   PRENATAL VISIT NOTE  Subjective:  Mckenzie Miller is a 35 y.o. G3P2002 at [redacted]w[redacted]d being seen today for ongoing prenatal care.  She is currently monitored for the following issues for this high-risk pregnancy and has Pneumonia due to COVID-19 virus; Prediabetes; Encounter for supervision of high risk pregnancy in second trimester, antepartum; Obesity in pregnancy; Chronic pain of right knee; Marijuana use; Nexplanon in place; Heart murmur of newborn; Lewis isoimmunization during pregnancy; Asymptomatic bacteriuria during pregnancy; SMA carrier; [redacted] weeks gestation of pregnancy; and BMI 50.0-59.9, adult (HCC) on their problem list.  Patient reports  tingling in hands- in left hand> right .  Contractions: Not present. Vag. Bleeding: None.  Movement: Present. Denies leaking of fluid.   The following portions of the patient's history were reviewed and updated as appropriate: allergies, current medications, past family history, past medical history, past social history, past surgical history and problem list.   Objective:   Vitals:   01/04/21 0858  BP: 122/81  Pulse: 85  Weight: (!) 328 lb (148.8 kg)    Fetal Status: Fetal Heart Rate (bpm): 150   Movement: Present     General:  Alert, oriented and cooperative. Patient is in no acute distress.  Skin: Skin is warm and dry. No rash noted.   Cardiovascular: Normal heart rate noted  Respiratory: Normal respiratory effort, no problems with respiration noted  Abdomen: Soft, gravid, appropriate for gestational age.  Pain/Pressure: Absent     Pelvic: Cervical exam deferred        Extremities: Normal range of motion.  Edema: Trace  Mental Status: Normal mood and affect. Normal behavior. Normal judgment and thought content.   Assessment and Plan:  Pregnancy: G3P2002 at [redacted]w[redacted]d  1. Encounter for supervision of high risk pregnancy in second trimester, antepartum ***  2. BMI 50.0-59.9, adult (HCC) TWG=48 lb (21.8 kg) which is well above her goal of  11-15 lab  3. Lewis isoimmunization during pregnancy in third trimester, single or unspecified fetus ***  4. Asymptomatic bacteriuria during pregnancy TOC   Preterm labor symptoms and general obstetric precautions including but not limited to vaginal bleeding, contractions, leaking of fluid and fetal movement were reviewed in detail with the patient. Please refer to After Visit Summary for other counseling recommendations.   Return in about 2 weeks (around 01/18/2021) for Routine prenatal care.  Future Appointments  Date Time Provider Department Center  01/30/2021  8:30 AM [place] NURSE [facility]  01/30/2021  8:45 AM [facility] US4 [facility]    Federico Flake, MD

## 2021-01-04 NOTE — Progress Notes (Signed)
Attestation of Supervision of Student:  I confirm that I have verified the information documented in the medical student's note and that I have also personally reperformed the history, physical exam and all medical decision making activities.  I have verified that all services and findings are accurately documented in this student's note; and I agree with management and plan as outlined in the documentation. I have also made any necessary editorial changes.   Federico Flake, MD Center for [facility], [facility] 01/04/2021 4:17 PM

## 2021-01-04 NOTE — Progress Notes (Signed)
ROB [redacted]w[redacted]d  CC: Left finger tips have a tingling/numb sensation that is consistent all day .  No change in diet, no nausea and vomiting pt unsure why weight loss.    *Consents to Female student to come in exam room.

## 2021-01-18 ENCOUNTER — Ambulatory Visit (INDEPENDENT_AMBULATORY_CARE_PROVIDER_SITE_OTHER): Payer: Medicaid Other | Admitting: Obstetrics

## 2021-01-18 ENCOUNTER — Encounter: Payer: Self-pay | Admitting: Obstetrics

## 2021-01-18 ENCOUNTER — Other Ambulatory Visit: Payer: Self-pay

## 2021-01-18 VITALS — BP 126/79 | HR 88 | Wt 336.4 lb

## 2021-01-18 DIAGNOSIS — R8271 Bacteriuria: Secondary | ICD-10-CM

## 2021-01-18 DIAGNOSIS — O99891 Other specified diseases and conditions complicating pregnancy: Secondary | ICD-10-CM

## 2021-01-18 DIAGNOSIS — O0992 Supervision of high risk pregnancy, unspecified, second trimester: Secondary | ICD-10-CM

## 2021-01-18 DIAGNOSIS — K219 Gastro-esophageal reflux disease without esophagitis: Secondary | ICD-10-CM

## 2021-01-18 LAB — POCT URINALYSIS DIPSTICK OB

## 2021-01-18 MED ORDER — PANTOPRAZOLE SODIUM 40 MG PO TBEC: 40.0000 mg | DELAYED_RELEASE_TABLET | Freq: Every day | ORAL | 5 refills | Status: DC

## 2021-01-18 NOTE — Progress Notes (Signed)
Subjective:  Mckenzie Miller is a 35 y.o. G3P2002 at [redacted]w[redacted]d being seen today for ongoing prenatal care.  She is currently monitored for the following issues for this low-risk pregnancy and has Pneumonia due to COVID-19 virus; Prediabetes; Encounter for supervision of high risk pregnancy in second trimester, antepartum; Obesity in pregnancy; Chronic pain of right knee; Marijuana use; Nexplanon in place; Heart murmur of newborn; Lewis isoimmunization during pregnancy; Asymptomatic bacteriuria during pregnancy; SMA carrier; [redacted] weeks gestation of pregnancy; and BMI 50.0-59.9, adult (HCC) on their problem list.  Patient reports heartburn.  Contractions: Not present. Vag. Bleeding: None.  Movement: Present. Denies leaking of fluid.   The following portions of the patient's history were reviewed and updated as appropriate: allergies, current medications, past family history, past medical history, past social history, past surgical history and problem list. Problem list updated.  Objective:   Vitals:   01/18/21 0901  BP: 126/79  Pulse: 88  Weight: (!) 336 lb 6.4 oz (152.6 kg)    Fetal Status:     Movement: Present     General:  Alert, oriented and cooperative. Patient is in no acute distress.  Skin: Skin is warm and dry. No rash noted.   Cardiovascular: Normal heart rate noted  Respiratory: Normal respiratory effort, no problems with respiration noted  Abdomen: Soft, gravid, appropriate for gestational age. Pain/Pressure: Absent     Pelvic:  Cervical exam deferred        Extremities: Normal range of motion.     Mental Status: Normal mood and affect. Normal behavior. Normal judgment and thought content.   Urinalysis:      Assessment and Plan:  Pregnancy: G3P2002 at [redacted]w[redacted]d  1. Encounter for supervision of high risk pregnancy in second trimester, antepartum  2. Asymptomatic bacteriuria during pregnancy Rx: - POC Urinalysis Dipstick OB - Culture, OB Urine  3. GERD without  esophagitis Rx: - pantoprazole (PROTONIX) 40 MG tablet; Take 1 tablet (40 mg total) by mouth daily.  Dispense: 30 tablet; Refill: 5    Preterm labor symptoms and general obstetric precautions including but not limited to vaginal bleeding, contractions, leaking of fluid and fetal movement were reviewed in detail with the patient. Please refer to After Visit Summary for other counseling recommendations.   Return in about 2 weeks (around 02/01/2021) for [facility].   Brock Bad, MD  01/18/21

## 2021-01-20 LAB — CULTURE, OB URINE

## 2021-01-20 LAB — URINE CULTURE, OB REFLEX

## 2021-01-30 ENCOUNTER — Ambulatory Visit: Payer: Medicaid Other | Admitting: *Deleted

## 2021-01-30 ENCOUNTER — Encounter: Payer: Self-pay | Admitting: *Deleted

## 2021-01-30 ENCOUNTER — Other Ambulatory Visit: Payer: Self-pay

## 2021-01-30 ENCOUNTER — Ambulatory Visit: Payer: Medicaid Other | Attending: Obstetrics and Gynecology

## 2021-01-30 ENCOUNTER — Other Ambulatory Visit: Payer: Self-pay | Admitting: Maternal & Fetal Medicine

## 2021-01-30 VITALS — BP 116/70 | HR 85

## 2021-01-30 DIAGNOSIS — F1290 Cannabis use, unspecified, uncomplicated: Secondary | ICD-10-CM | POA: Insufficient documentation

## 2021-01-30 DIAGNOSIS — Z148 Genetic carrier of other disease: Secondary | ICD-10-CM

## 2021-01-30 DIAGNOSIS — Z975 Presence of (intrauterine) contraceptive device: Secondary | ICD-10-CM

## 2021-01-30 DIAGNOSIS — O0993 Supervision of high risk pregnancy, unspecified, third trimester: Secondary | ICD-10-CM | POA: Diagnosis not present

## 2021-01-30 DIAGNOSIS — R011 Cardiac murmur, unspecified: Secondary | ICD-10-CM | POA: Diagnosis present

## 2021-01-30 DIAGNOSIS — R8271 Bacteriuria: Secondary | ICD-10-CM | POA: Diagnosis present

## 2021-01-30 DIAGNOSIS — Z6841 Body Mass Index (BMI) 40.0 and over, adult: Secondary | ICD-10-CM | POA: Diagnosis present

## 2021-01-30 DIAGNOSIS — O99213 Obesity complicating pregnancy, third trimester: Secondary | ICD-10-CM

## 2021-01-30 DIAGNOSIS — E669 Obesity, unspecified: Secondary | ICD-10-CM

## 2021-01-30 DIAGNOSIS — O0992 Supervision of high risk pregnancy, unspecified, second trimester: Secondary | ICD-10-CM | POA: Diagnosis present

## 2021-01-30 DIAGNOSIS — O99891 Other specified diseases and conditions complicating pregnancy: Secondary | ICD-10-CM | POA: Insufficient documentation

## 2021-01-30 DIAGNOSIS — O361930 Maternal care for other isoimmunization, third trimester, not applicable or unspecified: Secondary | ICD-10-CM | POA: Insufficient documentation

## 2021-01-30 DIAGNOSIS — Z3A32 32 weeks gestation of pregnancy: Secondary | ICD-10-CM | POA: Diagnosis not present

## 2021-01-30 DIAGNOSIS — O3660X0 Maternal care for excessive fetal growth, unspecified trimester, not applicable or unspecified: Secondary | ICD-10-CM

## 2021-02-01 ENCOUNTER — Ambulatory Visit (INDEPENDENT_AMBULATORY_CARE_PROVIDER_SITE_OTHER): Payer: Medicaid Other | Admitting: Obstetrics & Gynecology

## 2021-02-01 ENCOUNTER — Encounter: Payer: Self-pay | Admitting: Obstetrics & Gynecology

## 2021-02-01 ENCOUNTER — Other Ambulatory Visit: Payer: Self-pay

## 2021-02-01 VITALS — BP 126/80 | HR 88 | Wt 340.0 lb

## 2021-02-01 DIAGNOSIS — O0992 Supervision of high risk pregnancy, unspecified, second trimester: Secondary | ICD-10-CM

## 2021-02-01 DIAGNOSIS — R8271 Bacteriuria: Secondary | ICD-10-CM

## 2021-02-01 DIAGNOSIS — Z6841 Body Mass Index (BMI) 40.0 and over, adult: Secondary | ICD-10-CM

## 2021-02-01 DIAGNOSIS — O99891 Other specified diseases and conditions complicating pregnancy: Secondary | ICD-10-CM

## 2021-02-01 NOTE — Progress Notes (Signed)
   PRENATAL VISIT NOTE  Subjective:  Mckenzie Miller is a 35 y.o. G3P2002 at [redacted]w[redacted]d being seen today for ongoing prenatal care.  She is currently monitored for the following issues for this high-risk pregnancy and has Pneumonia due to COVID-19 virus; Prediabetes; Encounter for supervision of high risk pregnancy in second trimester, antepartum; Obesity in pregnancy; Chronic pain of right knee; Marijuana use; Nexplanon in place; Lewis isoimmunization during pregnancy; Asymptomatic bacteriuria during pregnancy; SMA carrier; and BMI 50.0-59.9, adult (HCC) on their problem list.  Patient reports no complaints.  Contractions: Not present. Vag. Bleeding: None.  Movement: Present. Denies leaking of fluid.   The following portions of the patient's history were reviewed and updated as appropriate: allergies, current medications, past family history, past medical history, past social history, past surgical history and problem list.   Objective:   Vitals:   02/01/21 0858  BP: 126/80  Pulse: 88  Weight: (!) 340 lb (154.2 kg)    Fetal Status: Fetal Heart Rate (bpm): 148   Movement: Present     General:  Alert, oriented and cooperative. Patient is in no acute distress.  Skin: Skin is warm and dry. No rash noted.   Cardiovascular: Normal heart rate noted  Respiratory: Normal respiratory effort, no problems with respiration noted  Abdomen: Soft, gravid, appropriate for gestational age.  Pain/Pressure: Present     Pelvic: Cervical exam deferred        Extremities: Normal range of motion.  Edema: Trace  Mental Status: Normal mood and affect. Normal behavior. Normal judgment and thought content.   Assessment and Plan:  Pregnancy: G3P2002 at [redacted]w[redacted]d 1. Encounter for supervision of high risk pregnancy in second trimester, antepartum Discussed plan for [facility]  2. BMI 50.0-59.9, adult (HCC) Weekly BPP soon  3. Asymptomatic bacteriuria during pregnancy Insignificant growth and no sx  Preterm labor  symptoms and general obstetric precautions including but not limited to vaginal bleeding, contractions, leaking of fluid and fetal movement were reviewed in detail with the patient. Please refer to After Visit Summary for other counseling recommendations.   Return in about 2 weeks (around 02/15/2021).  Future Appointments  Date Time Provider Department Center  02/15/2021  9:55 AM Adam Phenix, MD [facility] None  02/28/2021  8:30 AM [facility] NURSE [facility]  02/28/2021  8:45 AM [facility] US4 [facility]  03/06/2021 10:15 AM [facility] NST [facility]  03/13/2021 11:15 AM [facility] NST [facility]    Scheryl Darter, MD

## 2021-02-01 NOTE — Progress Notes (Signed)
ROB [redacted]w[redacted]d   pt has questions regarding Urine Cx result.   Declines Flu vaccine.

## 2021-02-09 ENCOUNTER — Other Ambulatory Visit: Payer: Self-pay

## 2021-02-09 ENCOUNTER — Encounter (HOSPITAL_COMMUNITY): Payer: Self-pay

## 2021-02-09 ENCOUNTER — Inpatient Hospital Stay (HOSPITAL_COMMUNITY)
Admission: AD | Admit: 2021-02-09 | Discharge: 2021-02-09 | Disposition: A | Discharge status: Home / Self Care | Payer: Medicaid Other | Attending: Family Medicine | Admitting: Family Medicine

## 2021-02-09 DIAGNOSIS — O163 Unspecified maternal hypertension, third trimester: Secondary | ICD-10-CM

## 2021-02-09 DIAGNOSIS — O99891 Other specified diseases and conditions complicating pregnancy: Secondary | ICD-10-CM

## 2021-02-09 DIAGNOSIS — Z87891 Personal history of nicotine dependence: Secondary | ICD-10-CM | POA: Diagnosis not present

## 2021-02-09 DIAGNOSIS — O26893 Other specified pregnancy related conditions, third trimester: Secondary | ICD-10-CM | POA: Insufficient documentation

## 2021-02-09 DIAGNOSIS — Z3A34 34 weeks gestation of pregnancy: Secondary | ICD-10-CM | POA: Insufficient documentation

## 2021-02-09 DIAGNOSIS — R519 Headache, unspecified: Secondary | ICD-10-CM | POA: Insufficient documentation

## 2021-02-09 DIAGNOSIS — R030 Elevated blood-pressure reading, without diagnosis of hypertension: Secondary | ICD-10-CM | POA: Diagnosis not present

## 2021-02-09 LAB — URINALYSIS, ROUTINE W REFLEX MICROSCOPIC
Bilirubin Urine: NEGATIVE
Glucose, UA: NEGATIVE mg/dL
Hgb urine dipstick: NEGATIVE
Ketones, ur: NEGATIVE mg/dL
Nitrite: NEGATIVE
Protein, ur: NEGATIVE mg/dL
Specific Gravity, Urine: 1.017 (ref 1.005–1.030)
WBC, UA: >50 WBC/hpf — ABNORMAL HIGH (ref 0–5)
pH: 6 (ref 5.0–8.0)

## 2021-02-09 LAB — COMPREHENSIVE METABOLIC PANEL
ALT: 25 U/L (ref 0–44)
AST: 27 U/L (ref 15–41)
Albumin: 2.3 g/dL — ABNORMAL LOW (ref 3.5–5.0)
Alkaline Phosphatase: 102 U/L (ref 38–126)
Anion gap: 7 (ref 5–15)
BUN: 7 mg/dL (ref 6–20)
CO2: 21 mmol/L — ABNORMAL LOW (ref 22–32)
Calcium: 8.5 mg/dL — ABNORMAL LOW (ref 8.9–10.3)
Chloride: 107 mmol/L (ref 98–111)
Creatinine, Ser: 0.72 mg/dL (ref 0.44–1.00)
GFR, Estimated: >60 mL/min (ref >60)
Glucose, Bld: 101 mg/dL — ABNORMAL HIGH (ref 70–99)
Potassium: 3.6 mmol/L (ref 3.5–5.1)
Sodium: 135 mmol/L (ref 135–145)
Total Bilirubin: 0.4 mg/dL (ref 0.3–1.2)
Total Protein: 5.9 g/dL — ABNORMAL LOW (ref 6.5–8.1)

## 2021-02-09 LAB — CBC
HCT: 31.6 % — ABNORMAL LOW (ref 36.0–46.0)
Hemoglobin: 10.3 g/dL — ABNORMAL LOW (ref 12.0–15.0)
MCH: 28.9 pg (ref 26.0–34.0)
MCHC: 32.6 g/dL (ref 30.0–36.0)
MCV: 88.8 fL (ref 80.0–100.0)
Platelets: 239 10*3/uL (ref 150–400)
RBC: 3.56 MIL/uL — ABNORMAL LOW (ref 3.87–5.11)
RDW: 13 % (ref 11.5–15.5)
WBC: 7.8 10*3/uL (ref 4.0–10.5)
nRBC: 0 % (ref 0.0–0.2)

## 2021-02-09 LAB — PROTEIN / CREATININE RATIO, URINE
Creatinine, Urine: 159.58 mg/dL
Protein Creatinine Ratio: 0.12 mg/mg{Cre} (ref 0.00–0.15)
Total Protein, Urine: 19 mg/dL

## 2021-02-09 MED ORDER — ACETAMINOPHEN 500 MG PO TABS
1000.0000 mg | ORAL_TABLET | Freq: Once | ORAL | Status: AC
  Administered 2021-02-09: 1000 mg via ORAL
  Filled 2021-02-09: qty 2

## 2021-02-09 MED ORDER — METOCLOPRAMIDE HCL 10 MG PO TABS
10.0000 mg | ORAL_TABLET | Freq: Once | ORAL | Status: AC
  Administered 2021-02-09: 10 mg via ORAL
  Filled 2021-02-09: qty 1

## 2021-02-09 NOTE — MAU Note (Addendum)
...  Mckenzie Miller is a 35 y.o. at [redacted]w[redacted]d here in MAU reporting: She woke up with a HA this morning at 0730. She states she checked her BP immediately and it was 154/94. She states she called the triage nurse and they recommended she come to MAU. Denies RUQ pain and visual disturbances. Bilateral swelling of both lower extremities. She states she took a baby ASA at 0845 and took her BP an hour later and it was 137/83. Endorses pelvic pain but states "that is my norm." +FM. No VB or LOF.   Patients 45 year old daughter with her. Patient states her best friend is on her way to get her.  Next OB appointment 02/15/2021.  BP: 142/78 P: 95 T: 97.9 F oral R: 19 O2: 99%  Pain score: 4/10 HA  FHT: 145 initial external Lab orders placed from triage: UA

## 2021-02-09 NOTE — Discharge Instructions (Signed)

## 2021-02-09 NOTE — MAU Provider Note (Signed)
History     CSN: [id number]  Arrival date and time: 02/09/21 1050   Event Date/Time   First Provider Initiated Contact with Patient 02/09/21 1125      Chief Complaint  Patient presents with   Headache   Hypertension   HPI Mckenzie Miller is a 35 y.o. G3P2002 at 77w0dwho presents with a headache and elevated BPs. She states she woke up with a headache this morning so she took her BP and it was elevated to the 150s/90s. She rates the headache a 4/10 and has not tried anything for the pain. She denies any visual changes or epigastric pain. She denies any abdominal pain, vaginal bleeding or discharge. Reports normal fetal movement. She denies any hx of HTN in the pregnancy.  OB History     Gravida  3   Para  2   Term  2   Preterm      AB      Living  2      SAB      IAB      Ectopic      Multiple      Live Births  2           Past Medical History:  Diagnosis Date   Anemia    Arthritis    Headache    Pneumonia    Vaginal Pap smear, abnormal     Past Surgical History:  Procedure Laterality Date   KNEE SURGERY      Family History  Problem Relation Age of Onset   Diabetes Mellitus II Mother    Kidney failure Mother    Breast cancer Mother 544  Asthma Daughter    Asthma Son     Social History   Tobacco Use   Smoking status: Former    Packs/day: 0.25    Types: Cigarettes    Quit date: 01/16/2020    Years since quitting: 1.0   Smokeless tobacco: Never  Vaping Use   Vaping Use: Never used  Substance Use Topics   Alcohol use: Not Currently    Comment: Not since confirmed pregnancy   Drug use: Not Currently    Types: Marijuana    Comment: 1 x a week    Allergies:  Allergies  Allergen Reactions   Sulfa Antibiotics Hives   Sulfites Hives    Unsure which one, allergic     Medications Prior to Admission  Medication Sig Dispense Refill Last Dose   aspirin EC 81 MG tablet Take 1 tablet (81 mg total) by mouth daily. Swallow whole. 30  tablet 6 02/09/2021 at 0845   pantoprazole (PROTONIX) 40 MG tablet Take 1 tablet (40 mg total) by mouth daily. 30 tablet 5 02/09/2021   Prenatal Vit-Fe Phos-FA-Omega (VITAFOL GUMMIES) 3.33-0.333-34.8 MG CHEW Chew 3 tablets by mouth daily. 90 tablet 11 02/08/2021   acetaminophen (TYLENOL) 325 MG tablet Take 650 mg by mouth every 6 (six) hours as needed for mild pain or headache.  (Patient not taking: Reported on 02/01/2021)      Blood Glucose Monitoring Suppl (TRUE METRIX METER) w/Device KIT 1 kit by Does not apply route in the morning and at bedtime. (Patient not taking: No sig reported) 1 kit 0    Blood Pressure Monitoring (BLOOD PRESSURE KIT) DEVI 1 kit by Does not apply route once a week. 1 each 0    glucose blood (TRUE METRIX PRO BLOOD GLUCOSE) test strip Use as instructed (Patient not taking: No sig reported)  100 each 12     Review of Systems  Constitutional: Negative.  Negative for fatigue and fever.  HENT: Negative.    Respiratory: Negative.  Negative for shortness of breath.   Cardiovascular: Negative.  Negative for chest pain.  Gastrointestinal: Negative.  Negative for abdominal pain, constipation, diarrhea, nausea and vomiting.  Genitourinary: Negative.  Negative for dysuria, vaginal bleeding and vaginal discharge.  Neurological:  Positive for headaches. Negative for dizziness.  Physical Exam   Blood pressure (!) 142/78, pulse 94, temperature 97.9 F (36.6 C), temperature source Oral, resp. rate 19, last menstrual period 06/16/2020, SpO2 99 %, unknown if currently breastfeeding.  Patient Vitals for the past 24 hrs:  BP Temp Temp src Pulse Resp SpO2  02/09/21 1301 139/87 -- -- 84 -- 100 %  02/09/21 1246 (!) 142/83 -- -- 87 -- --  02/09/21 1230 (!) 141/76 -- -- 77 -- 99 %  02/09/21 1216 139/86 -- -- 85 -- --  02/09/21 1201 136/86 -- -- 84 -- --  02/09/21 1146 140/84 -- -- 91 -- 99 %  02/09/21 1131 (!) 141/84 -- -- 87 -- --  02/09/21 1115 (!) 142/78 97.9 F (36.6 C) Oral 94 19  99 %   Physical Exam Vitals and nursing note reviewed.  Constitutional:      General: She is not in acute distress.    Appearance: She is well-developed.  HENT:     Head: Normocephalic.  Eyes:     Pupils: Pupils are equal, round, and reactive to light.  Cardiovascular:     Rate and Rhythm: Normal rate and regular rhythm.     Heart sounds: Normal heart sounds.  Pulmonary:     Effort: Pulmonary effort is normal. No respiratory distress.     Breath sounds: Normal breath sounds.  Abdominal:     General: Bowel sounds are normal. There is no distension.     Palpations: Abdomen is soft.     Tenderness: There is no abdominal tenderness.  Skin:    General: Skin is warm and dry.  Neurological:     Mental Status: She is alert and oriented to person, place, and time.  Psychiatric:        Mood and Affect: Mood normal.        Behavior: Behavior normal.        Thought Content: Thought content normal.        Judgment: Judgment normal.   Fetal Tracing:  Baseline: 135 Variability: moderate Accels: 15x15 Decels: none  Toco: none  MAU Course  Procedures Results for orders placed or performed during the hospital encounter of 02/09/21 (from the past 24 hour(s))  Protein / creatinine ratio, urine     Status: None   Collection Time: 02/09/21 11:20 AM  Result Value Ref Range   Creatinine, Urine 159.58 mg/dL   Total Protein, Urine 19 mg/dL   Protein Creatinine Ratio 0.12 0.00 - 0.15 mg/mg[Cre]  Urinalysis, Routine w reflex microscopic Urine, Clean Catch     Status: Abnormal   Collection Time: 02/09/21 11:20 AM  Result Value Ref Range   Color, Urine YELLOW YELLOW   APPearance HAZY (A) CLEAR   Specific Gravity, Urine 1.017 1.005 - 1.030   pH 6.0 5.0 - 8.0   Glucose, UA NEGATIVE NEGATIVE mg/dL   Hgb urine dipstick NEGATIVE NEGATIVE   Bilirubin Urine NEGATIVE NEGATIVE   Ketones, ur NEGATIVE NEGATIVE mg/dL   Protein, ur NEGATIVE NEGATIVE mg/dL   Nitrite NEGATIVE NEGATIVE    Leukocytes,Ua LARGE (  A) NEGATIVE   RBC / HPF 6-10 0 - 5 RBC/hpf   WBC, UA >50 (H) 0 - 5 WBC/hpf   Bacteria, UA RARE (A) NONE SEEN   Squamous Epithelial / LPF 0-5 0 - 5   Mucus PRESENT   CBC     Status: Abnormal   Collection Time: 02/09/21 11:24 AM  Result Value Ref Range   WBC 7.8 4.0 - 10.5 K/uL   RBC 3.56 (L) 3.87 - 5.11 MIL/uL   Hemoglobin 10.3 (L) 12.0 - 15.0 g/dL   HCT 31.6 (L) 36.0 - 46.0 %   MCV 88.8 80.0 - 100.0 fL   MCH 28.9 26.0 - 34.0 pg   MCHC 32.6 30.0 - 36.0 g/dL   RDW 13.0 11.5 - 15.5 %   Platelets 239 150 - 400 K/uL   nRBC 0.0 0.0 - 0.2 %  Comprehensive metabolic panel     Status: Abnormal   Collection Time: 02/09/21 11:24 AM  Result Value Ref Range   Sodium 135 135 - 145 mmol/L   Potassium 3.6 3.5 - 5.1 mmol/L   Chloride 107 98 - 111 mmol/L   CO2 21 (L) 22 - 32 mmol/L   Glucose, Bld 101 (H) 70 - 99 mg/dL   BUN 7 6 - 20 mg/dL   Creatinine, Ser 0.72 0.44 - 1.00 mg/dL   Calcium 8.5 (L) 8.9 - 10.3 mg/dL   Total Protein 5.9 (L) 6.5 - 8.1 g/dL   Albumin 2.3 (L) 3.5 - 5.0 g/dL   AST 27 15 - 41 U/L   ALT 25 0 - 44 U/L   Alkaline Phosphatase 102 38 - 126 U/L   Total Bilirubin 0.4 0.3 - 1.2 mg/dL   GFR, Estimated >60 >60 mL/min   Anion gap 7 5 - 15    MDM UA, UC CBC, CMP, Protein/creat ratio  Assessment and Plan   1. Elevated blood pressure affecting pregnancy in third trimester, antepartum   2. [redacted] weeks gestation of pregnancy    -Discharge home in stable condition -Preeclampsia precautions discussed -Patient advised to follow-up with OB as scheduled for prenatal care -Patient may return to MAU as needed or if her condition were to change or worsen   [place] 02/09/2021, 11:25 AM

## 2021-02-09 NOTE — MAU Note (Signed)
This RN accompanied the patients daughter to the front lobby where April, the patients best friend, was waiting to pick her up.

## 2021-02-15 ENCOUNTER — Other Ambulatory Visit: Payer: Self-pay

## 2021-02-15 ENCOUNTER — Ambulatory Visit (INDEPENDENT_AMBULATORY_CARE_PROVIDER_SITE_OTHER): Payer: Medicaid Other | Admitting: Obstetrics & Gynecology

## 2021-02-15 VITALS — BP 130/82 | HR 105 | Wt 342.0 lb

## 2021-02-15 DIAGNOSIS — O0992 Supervision of high risk pregnancy, unspecified, second trimester: Secondary | ICD-10-CM

## 2021-02-15 DIAGNOSIS — O99210 Obesity complicating pregnancy, unspecified trimester: Secondary | ICD-10-CM

## 2021-02-15 LAB — POCT URINALYSIS DIPSTICK
Bilirubin, UA: NEGATIVE
Glucose, UA: POSITIVE — AB
Ketones, UA: NEGATIVE
Nitrite, UA: NEGATIVE
Protein, UA: NEGATIVE
Spec Grav, UA: >=1.03 — AB (ref 1.010–1.025)
Urobilinogen, UA: 0.2 E.U./dL
pH, UA: 6 (ref 5.0–8.0)

## 2021-02-15 NOTE — Progress Notes (Signed)
Pt had recent MAU visit for increase in BP, pt states no other symptoms today.  Please discuss birth control with pt, ? Interested in BTL.

## 2021-02-15 NOTE — Progress Notes (Signed)
PRENATAL VISIT NOTE  Subjective:  Mckenzie Miller is a 35 y.o. G3P2002 at [redacted]w[redacted]d being seen today for ongoing prenatal care.  She is currently monitored for the following issues for this high-risk pregnancy and has Pneumonia due to COVID-19 virus; Prediabetes; Encounter for supervision of high risk pregnancy in second trimester, antepartum; Obesity in pregnancy; Chronic pain of right knee; Marijuana use; Nexplanon in place; Lewis isoimmunization during pregnancy; Asymptomatic bacteriuria during pregnancy; SMA carrier; and BMI 50.0-59.9, adult (HCC) on their problem list.  Patient reports she has been feeling well since her visit to the MAU on 02/09/21. She does not endorse any headaches, changes in her vision, worsened swelling in her legs, or RUQ pain. She does not endorse any pain or vaginal bleeding. She has been monitoring her blood pressure at home with readings fluctuating between 120-130s systolic and 60-70s diastolic.  Contractions: Not present. Vag. Bleeding: None.  Movement: Present. Denies leaking of fluid.   The following portions of the patient's history were reviewed and updated as appropriate: allergies, current medications, past family history, past medical history, past social history, past surgical history and problem list.   Objective:   Vitals:   02/15/21 0950 02/15/21 0959  BP: (!) 135/92 130/82  Pulse: (!) 102 (!) 105  Weight: (!) 342 lb (155.1 kg)     Fetal Status: Fetal Heart Rate (bpm): 145 Fundal Height: 38 cm Movement: Present     General:  Alert, oriented and cooperative. Patient is in no acute distress.  Skin: Skin is warm and dry. No rash noted.   Cardiovascular: Normal heart rate noted  Respiratory: Normal respiratory effort, no problems with respiration noted  Abdomen: Soft, gravid, appropriate for gestational age.  Pain/Pressure: Present     Pelvic: Fundal height appreciated at 38 cm. Non-tender to palpation.        Extremities: Normal range of motion.      Mental Status: Normal mood and affect. Normal behavior. Normal judgment and thought content.   Assessment and Plan:  Pregnancy: G3P2002 at [redacted]w[redacted]d 1. Obesity in pregnancy Patient inquired about tubal ligation and was informed this may not be the best option for contraception following her current pregnancy due to abdominal size. Patient encouraged to consider alternative methods of contraception in the postpartum period.  2. Encounter for supervision of high risk pregnancy in second trimester, antepartum Symptoms initiating MAU visit on 02/09/21 have appeared to have resolved based on patient report and clinical presentation today. - Encouraged to continue monitoring home BP - Return precautions given for recurrence of elevated pressures, headache, changes in vision. - POCT urinalysis dipstick - Culture, OB Urine Follow BP, possible GHTN Preterm labor symptoms and general obstetric precautions including but not limited to vaginal bleeding, contractions, leaking of fluid and fetal movement were reviewed in detail with the patient. Please refer to After Visit Summary for other counseling recommendations.   Return in about 1 week (around 02/22/2021).  Future Appointments  Date Time Provider Department Center  02/25/2021  3:50 PM Constant, Gigi Gin, MD [facility] None  02/28/2021  8:30 AM [facility] NURSE [facility]  02/28/2021  8:45 AM [facility] US4 [facility]  03/06/2021 10:15 AM [facility]ST [place] [facility]  03/13/2021 11:15 AM [facility]    Val Eagle, Medical Student  Attestation of Attending Supervision of Medical Student: Evaluation and management procedures were performed by the medical student under my supervision and collaboration.  I have reviewed the student's note and chart, and I agree with  the management and plan.  Scheryl Darter, MD, FACOG Attending Obstetrician & Gynecologist Faculty Practice, [facility]

## 2021-02-17 LAB — URINE CULTURE, OB REFLEX

## 2021-02-17 LAB — CULTURE, OB URINE

## 2021-02-25 ENCOUNTER — Other Ambulatory Visit: Payer: Self-pay

## 2021-02-25 ENCOUNTER — Encounter: Payer: Self-pay | Admitting: Obstetrics and Gynecology

## 2021-02-25 ENCOUNTER — Ambulatory Visit (INDEPENDENT_AMBULATORY_CARE_PROVIDER_SITE_OTHER): Payer: Medicaid Other | Admitting: Obstetrics and Gynecology

## 2021-02-25 ENCOUNTER — Other Ambulatory Visit (HOSPITAL_COMMUNITY)
Admission: RE | Admit: 2021-02-25 | Discharge: 2021-02-25 | Disposition: A | Discharge status: Home / Self Care | Payer: Medicaid Other | Source: Ambulatory Visit | Attending: Obstetrics and Gynecology | Admitting: Obstetrics and Gynecology

## 2021-02-25 VITALS — BP 134/80 | HR 86 | Wt 340.0 lb

## 2021-02-25 DIAGNOSIS — O0992 Supervision of high risk pregnancy, unspecified, second trimester: Secondary | ICD-10-CM | POA: Diagnosis present

## 2021-02-25 DIAGNOSIS — O99210 Obesity complicating pregnancy, unspecified trimester: Secondary | ICD-10-CM

## 2021-02-25 LAB — OB RESULTS CONSOLE GC/CHLAMYDIA: Gonorrhea: NEGATIVE

## 2021-02-25 MED ORDER — TERCONAZOLE 0.8 % VA CREA: 1.0000 | TOPICAL_CREAM | Freq: Every day | VAGINAL | 0 refills | Status: DC

## 2021-02-25 NOTE — Progress Notes (Signed)
   PRENATAL VISIT NOTE  Subjective:  Mckenzie Miller is a 35 y.o. G3P2002 at [redacted]w[redacted]d being seen today for ongoing prenatal care.  She is currently monitored for the following issues for this high-risk pregnancy and has Pneumonia due to COVID-19 virus; Prediabetes; Encounter for supervision of high risk pregnancy in second trimester, antepartum; Obesity in pregnancy; Chronic pain of right knee; Marijuana use; Nexplanon in place; Lewis isoimmunization during pregnancy; Asymptomatic bacteriuria during pregnancy; SMA carrier; and BMI 50.0-59.9, adult (HCC) on their problem list.  Patient reports no complaints.  Contractions: Irritability. Vag. Bleeding: None.  Movement: Present. Denies leaking of fluid.   The following portions of the patient's history were reviewed and updated as appropriate: allergies, current medications, past family history, past medical history, past social history, past surgical history and problem list.   Objective:   Vitals:   02/25/21 1602  BP: 134/80  Pulse: 86  Weight: (!) 340 lb (154.2 kg)    Fetal Status: Fetal Heart Rate (bpm): 140 Fundal Height: 39 cm Movement: Present     General:  Alert, oriented and cooperative. Patient is in no acute distress.  Skin: Skin is warm and dry. No rash noted.   Cardiovascular: Normal heart rate noted  Respiratory: Normal respiratory effort, no problems with respiration noted  Abdomen: Soft, gravid, appropriate for gestational age.  Pain/Pressure: Present     Pelvic: Cervical exam performed in the presence of a chaperone Dilation: Closed Effacement (%): Thick Station: ?  (Error. Value could not be saved.)  Extremities: Normal range of motion.  Edema: Trace  Mental Status: Normal mood and affect. Normal behavior. Normal judgment and thought content.   Assessment and Plan:  Pregnancy: G3P2002 at [redacted]w[redacted]d 1. Encounter for supervision of high risk pregnancy in second trimester, antepartum Patient is doing well without  complaints Cultures today  2. Obesity in pregnancy Continue ASA Follow up growth and weekly BPP starting on 11/3  Preterm labor symptoms and general obstetric precautions including but not limited to vaginal bleeding, contractions, leaking of fluid and fetal movement were reviewed in detail with the patient. Please refer to After Visit Summary for other counseling recommendations.   Return for in person, ROB, High risk.  Future Appointments  Date Time Provider Department Center  02/28/2021  8:30 AM [facility] NURSE [facility]  02/28/2021  8:45 AM [facility] US4 [facility]  03/06/2021 10:15 AM [facility] NST [facility]  03/13/2021 11:15 AM [facility]    Abdalrahman Clementson, MD

## 2021-02-25 NOTE — Progress Notes (Signed)
+   Fetal movement. Pt thinks she may have yeast infection

## 2021-02-27 LAB — CERVICOVAGINAL ANCILLARY ONLY
Bacterial Vaginitis (gardnerella): NEGATIVE
Candida Glabrata: NEGATIVE
Candida Vaginitis: POSITIVE — AB
Chlamydia: NEGATIVE
Comment: NEGATIVE
Comment: NEGATIVE
Comment: NEGATIVE
Comment: NEGATIVE
Comment: NORMAL
Neisseria Gonorrhea: NEGATIVE

## 2021-02-28 ENCOUNTER — Ambulatory Visit: Payer: Medicaid Other | Admitting: *Deleted

## 2021-02-28 ENCOUNTER — Encounter: Payer: Self-pay | Admitting: *Deleted

## 2021-02-28 ENCOUNTER — Ambulatory Visit: Payer: Medicaid Other | Attending: Obstetrics and Gynecology

## 2021-02-28 ENCOUNTER — Other Ambulatory Visit: Payer: Self-pay

## 2021-02-28 VITALS — BP 117/78 | HR 86

## 2021-02-28 DIAGNOSIS — Z3A36 36 weeks gestation of pregnancy: Secondary | ICD-10-CM

## 2021-02-28 DIAGNOSIS — Z148 Genetic carrier of other disease: Secondary | ICD-10-CM | POA: Insufficient documentation

## 2021-02-28 DIAGNOSIS — E669 Obesity, unspecified: Secondary | ICD-10-CM | POA: Diagnosis not present

## 2021-02-28 DIAGNOSIS — O99213 Obesity complicating pregnancy, third trimester: Secondary | ICD-10-CM | POA: Insufficient documentation

## 2021-02-28 DIAGNOSIS — O3660X0 Maternal care for excessive fetal growth, unspecified trimester, not applicable or unspecified: Secondary | ICD-10-CM | POA: Insufficient documentation

## 2021-02-28 DIAGNOSIS — Z6841 Body Mass Index (BMI) 40.0 and over, adult: Secondary | ICD-10-CM | POA: Insufficient documentation

## 2021-03-01 LAB — CULTURE, BETA STREP (GROUP B ONLY): Strep Gp B Culture: NEGATIVE

## 2021-03-04 ENCOUNTER — Ambulatory Visit (INDEPENDENT_AMBULATORY_CARE_PROVIDER_SITE_OTHER): Payer: Medicaid Other | Admitting: Obstetrics and Gynecology

## 2021-03-04 ENCOUNTER — Encounter: Payer: Self-pay | Admitting: Obstetrics and Gynecology

## 2021-03-04 ENCOUNTER — Other Ambulatory Visit: Payer: Self-pay

## 2021-03-04 VITALS — BP 122/85 | HR 94 | Wt 346.0 lb

## 2021-03-04 DIAGNOSIS — O99210 Obesity complicating pregnancy, unspecified trimester: Secondary | ICD-10-CM

## 2021-03-04 DIAGNOSIS — O0992 Supervision of high risk pregnancy, unspecified, second trimester: Secondary | ICD-10-CM

## 2021-03-04 NOTE — Progress Notes (Signed)
   PRENATAL VISIT NOTE  Subjective:  Mckenzie Miller is a 35 y.o. G3P2002 at [redacted]w[redacted]d being seen today for ongoing prenatal care.  She is currently monitored for the following issues for this high-risk pregnancy and has Pneumonia due to COVID-19 virus; Prediabetes; Encounter for supervision of high risk pregnancy in second trimester, antepartum; Obesity in pregnancy; Chronic pain of right knee; Marijuana use; Nexplanon in place; Lewis isoimmunization during pregnancy; Asymptomatic bacteriuria during pregnancy; SMA carrier; and BMI 50.0-59.9, adult (HCC) on their problem list.  Patient reports vaginal irritation.  Contractions: Not present. Vag. Bleeding: None.  Movement: Present. Denies leaking of fluid.   The following portions of the patient's history were reviewed and updated as appropriate: allergies, current medications, past family history, past medical history, past social history, past surgical history and problem list.   Objective:   Vitals:   03/04/21 1610  BP: 122/85  Pulse: 94  Weight: (!) 346 lb (156.9 kg)    Fetal Status: Fetal Heart Rate (bpm): 142 Fundal Height: 40 cm Movement: Present     General:  Alert, oriented and cooperative. Patient is in no acute distress.  Skin: Skin is warm and dry. No rash noted.   Cardiovascular: Normal heart rate noted  Respiratory: Normal respiratory effort, no problems with respiration noted  Abdomen: Soft, gravid, appropriate for gestational age.  Pain/Pressure: Absent     Pelvic: Cervical exam performed in the presence of a chaperone Dilation: Fingertip Effacement (%): Thick Station: Ballotable  Extremities: Normal range of motion.  Edema: Trace  Mental Status: Normal mood and affect. Normal behavior. Normal judgment and thought content.   Assessment and Plan:  Pregnancy: G3P2002 at [redacted]w[redacted]d 1. Encounter for supervision of high risk pregnancy in second trimester, antepartum Patient is doing well  Patient reports persistent vaginal  irritation but was not aware that prescription was sent last week for yeast infection  2. Obesity in pregnancy Continue ASA Follow up ultrasound on 03/06/21  Term labor symptoms and general obstetric precautions including but not limited to vaginal bleeding, contractions, leaking of fluid and fetal movement were reviewed in detail with the patient. Please refer to After Visit Summary for other counseling recommendations.   Return in about 1 week (around 03/11/2021) for in person, ROB, Low risk.  Future Appointments  Date Time Provider Department Center  03/06/2021 10:15 AM [facility] NST [facility]  03/13/2021 11:15 AM [facility]    Catalina Antigua, MD

## 2021-03-06 ENCOUNTER — Ambulatory Visit (INDEPENDENT_AMBULATORY_CARE_PROVIDER_SITE_OTHER): Payer: Medicaid Other

## 2021-03-06 ENCOUNTER — Other Ambulatory Visit: Payer: Self-pay

## 2021-03-06 ENCOUNTER — Ambulatory Visit (INDEPENDENT_AMBULATORY_CARE_PROVIDER_SITE_OTHER): Payer: Medicaid Other | Admitting: General Practice

## 2021-03-06 VITALS — BP 126/81 | HR 101

## 2021-03-06 DIAGNOSIS — O99210 Obesity complicating pregnancy, unspecified trimester: Secondary | ICD-10-CM

## 2021-03-06 NOTE — Progress Notes (Signed)
Pt informed that the ultrasound is considered a limited OB ultrasound and is not intended to be a complete ultrasound exam.  Patient also informed that the ultrasound is not being completed with the intent of assessing for fetal or placental anomalies or any pelvic abnormalities.  Explained that the purpose of today's ultrasound is to assess for  BPP, presentation, and AFI.  Patient acknowledges the purpose of the exam and the limitations of the study.     Chase Caller RN BSN 03/06/21

## 2021-03-11 ENCOUNTER — Ambulatory Visit (INDEPENDENT_AMBULATORY_CARE_PROVIDER_SITE_OTHER): Payer: Medicaid Other | Admitting: Obstetrics and Gynecology

## 2021-03-11 ENCOUNTER — Other Ambulatory Visit: Payer: Self-pay

## 2021-03-11 VITALS — BP 120/81 | HR 84 | Wt 351.6 lb

## 2021-03-11 DIAGNOSIS — Z6841 Body Mass Index (BMI) 40.0 and over, adult: Secondary | ICD-10-CM

## 2021-03-11 DIAGNOSIS — O3663X1 Maternal care for excessive fetal growth, third trimester, fetus 1: Secondary | ICD-10-CM

## 2021-03-11 DIAGNOSIS — R7303 Prediabetes: Secondary | ICD-10-CM

## 2021-03-11 DIAGNOSIS — Z3A38 38 weeks gestation of pregnancy: Secondary | ICD-10-CM | POA: Insufficient documentation

## 2021-03-11 DIAGNOSIS — O361930 Maternal care for other isoimmunization, third trimester, not applicable or unspecified: Secondary | ICD-10-CM

## 2021-03-11 DIAGNOSIS — O0993 Supervision of high risk pregnancy, unspecified, third trimester: Secondary | ICD-10-CM

## 2021-03-11 NOTE — Progress Notes (Signed)
   PRENATAL VISIT NOTE  Subjective:  Mckenzie Miller is a 35 y.o. G3P2002 at [redacted]w[redacted]d being seen today for ongoing prenatal care.  She is currently monitored for the following issues for this high-risk pregnancy and has Pneumonia due to COVID-19 virus; Prediabetes; Encounter for supervision of high risk pregnancy in third trimester, antepartum; Obesity in pregnancy; Chronic pain of right knee; Marijuana use; Nexplanon in place; Lewis isoimmunization during pregnancy; Asymptomatic bacteriuria during pregnancy; SMA carrier; BMI 50.0-59.9, adult (HCC); [redacted] weeks gestation of pregnancy; and LGA (large for gestational age) fetus affecting management of mother, third trimester, fetus 1 on their problem list.  Patient doing well with no acute concerns today. She reports no complaints.  Contractions: Irritability. Vag. Bleeding: None.  Movement: Present. Denies leaking of fluid currently.   The following portions of the patient's history were reviewed and updated as appropriate: allergies, current medications, past family history, past medical history, past social history, past surgical history and problem list. Problem list updated.  Objective:   Vitals:   03/11/21 0845  BP: 120/81  Pulse: 84  Weight: (!) 351 lb 9.6 oz (159.5 kg)    Fetal Status: Fetal Heart Rate (bpm): 141 Fundal Height: 43 cm Movement: Present     General:  Alert, oriented and cooperative. Patient is in no acute distress.  Skin: Skin is warm and dry. No rash noted.   Cardiovascular: Normal heart rate noted  Respiratory: Normal respiratory effort, no problems with respiration noted  Abdomen: Soft, gravid, appropriate for gestational age.  Pain/Pressure: Present     Pelvic: Cervical exam performed Dilation: 1 Effacement (%): 50 Station: Ballotable  Extremities: Normal range of motion.  Edema: Trace  Mental Status:  Normal mood and affect. Normal behavior. Normal judgment and thought content.   Assessment and Plan:  Pregnancy:  G3P2002 at [redacted]w[redacted]d  1. [redacted] weeks gestation of pregnancy   2. Lewis isoimmunization during pregnancy in third trimester, single or unspecified fetus   3. BMI 50.0-59.9, adult (HCC)   4. Prediabetes   5. Encounter for supervision of high risk pregnancy in third trimester, antepartum Continue routine care, IOL at 40.2 weeks  6. LGA (large for gestational age) fetus affecting management of mother, third trimester, fetus 1 Last EFW on 02/27/21 > 99% 8 lbs 12 ounces.  Largest delivery was 8 lbs 12 ounces.  Discussed IOL and MFM recommendations at 39 weeks.  Pt desires to get to at least 40 weeks.  IOL at 40.2 weeks with consideration for holiday weekend.  Term labor symptoms and general obstetric precautions including but not limited to vaginal bleeding, contractions, leaking of fluid and fetal movement were reviewed in detail with the patient.  Please refer to After Visit Summary for other counseling recommendations.   Return in about 1 week (around 03/18/2021) for [facility], in person.   Mariel Aloe, MD Faculty Attending Center for [facility]

## 2021-03-11 NOTE — Progress Notes (Signed)
ROB 38.2 wks Reports LOF right after getting up from urinating off and on X 1 week. Wearing pad that gets a little wet. Not sure if it is just discharge.

## 2021-03-13 ENCOUNTER — Ambulatory Visit (INDEPENDENT_AMBULATORY_CARE_PROVIDER_SITE_OTHER): Payer: Medicaid Other

## 2021-03-13 ENCOUNTER — Other Ambulatory Visit: Payer: Self-pay

## 2021-03-13 ENCOUNTER — Ambulatory Visit: Payer: Medicaid Other | Admitting: *Deleted

## 2021-03-13 VITALS — BP 119/85 | HR 84 | Wt 351.8 lb

## 2021-03-13 DIAGNOSIS — O99210 Obesity complicating pregnancy, unspecified trimester: Secondary | ICD-10-CM

## 2021-03-13 NOTE — Progress Notes (Signed)

## 2021-03-18 ENCOUNTER — Encounter: Payer: Self-pay | Admitting: Obstetrics and Gynecology

## 2021-03-18 ENCOUNTER — Other Ambulatory Visit: Payer: Self-pay | Admitting: Advanced Practice Midwife

## 2021-03-18 ENCOUNTER — Ambulatory Visit (INDEPENDENT_AMBULATORY_CARE_PROVIDER_SITE_OTHER): Payer: Medicaid Other | Admitting: Obstetrics and Gynecology

## 2021-03-18 ENCOUNTER — Other Ambulatory Visit: Payer: Self-pay

## 2021-03-18 VITALS — BP 135/84 | HR 80 | Wt 354.0 lb

## 2021-03-18 DIAGNOSIS — O99210 Obesity complicating pregnancy, unspecified trimester: Secondary | ICD-10-CM

## 2021-03-18 DIAGNOSIS — O3663X1 Maternal care for excessive fetal growth, third trimester, fetus 1: Secondary | ICD-10-CM

## 2021-03-18 DIAGNOSIS — O0993 Supervision of high risk pregnancy, unspecified, third trimester: Secondary | ICD-10-CM

## 2021-03-18 NOTE — Progress Notes (Signed)
+   Fetal movement. No complaints.  

## 2021-03-18 NOTE — Progress Notes (Signed)
   PRENATAL VISIT NOTE  Subjective:  Mckenzie Miller is a 35 y.o. G3P2002 at [redacted]w[redacted]d being seen today for ongoing prenatal care.  She is currently monitored for the following issues for this high-risk pregnancy and has Pneumonia due to COVID-19 virus; Prediabetes; Encounter for supervision of high risk pregnancy in third trimester, antepartum; Obesity in pregnancy; Chronic pain of right knee; Marijuana use; Nexplanon in place; Lewis isoimmunization during pregnancy; Asymptomatic bacteriuria during pregnancy; SMA carrier; BMI 50.0-59.9, adult (HCC); and LGA (large for gestational age) fetus affecting management of mother, third trimester, fetus 1 on their problem list.  Patient reports no complaints.  Contractions: Irritability. Vag. Bleeding: None.  Movement: Present. Denies leaking of fluid.   The following portions of the patient's history were reviewed and updated as appropriate: allergies, current medications, past family history, past medical history, past social history, past surgical history and problem list.   Objective:   Vitals:   03/18/21 1502  BP: 135/84  Pulse: 80  Weight: (!) 354 lb (160.6 kg)    Fetal Status: Fetal Heart Rate (bpm): 132 Fundal Height: 43 cm Movement: Present     General:  Alert, oriented and cooperative. Patient is in no acute distress.  Skin: Skin is warm and dry. No rash noted.   Cardiovascular: Normal heart rate noted  Respiratory: Normal respiratory effort, no problems with respiration noted  Abdomen: Soft, gravid, appropriate for gestational age.  Pain/Pressure: Present     Pelvic: Cervical exam deferred        Extremities: Normal range of motion.  Edema: Trace  Mental Status: Normal mood and affect. Normal behavior. Normal judgment and thought content.   Assessment and Plan:  Pregnancy: G3P2002 at [redacted]w[redacted]d 1. Encounter for supervision of high risk pregnancy in third trimester, antepartum Patient is doing well without complaints  2. LGA (large for  gestational age) fetus affecting management of mother, third trimester, fetus 1 Scheduled for IOL at 40.2 Pelvis proven to 8lb12oz  3. Obesity in pregnancy Follow up ultrasound 11/22  Term labor symptoms and general obstetric precautions including but not limited to vaginal bleeding, contractions, leaking of fluid and fetal movement were reviewed in detail with the patient. Please refer to After Visit Summary for other counseling recommendations.   No follow-ups on file.  Future Appointments  Date Time Provider Department Center  03/19/2021  8:15 AM [facility]  03/25/2021  6:30 AM [facility] SCHED ROOM [facility] None    Catalina Antigua, MD

## 2021-03-19 ENCOUNTER — Ambulatory Visit: Payer: Medicaid Other | Admitting: *Deleted

## 2021-03-19 ENCOUNTER — Ambulatory Visit (INDEPENDENT_AMBULATORY_CARE_PROVIDER_SITE_OTHER): Payer: Medicaid Other

## 2021-03-19 VITALS — BP 130/90 | HR 82 | Wt 354.0 lb

## 2021-03-19 DIAGNOSIS — O99210 Obesity complicating pregnancy, unspecified trimester: Secondary | ICD-10-CM | POA: Diagnosis not present

## 2021-03-19 DIAGNOSIS — O403XX0 Polyhydramnios, third trimester, not applicable or unspecified: Secondary | ICD-10-CM

## 2021-03-19 NOTE — Progress Notes (Signed)

## 2021-03-23 ENCOUNTER — Inpatient Hospital Stay (HOSPITAL_COMMUNITY): Admit: 2021-03-23 | Payer: Self-pay

## 2021-03-25 ENCOUNTER — Encounter (HOSPITAL_COMMUNITY): Payer: Self-pay | Admitting: Obstetrics and Gynecology

## 2021-03-25 ENCOUNTER — Inpatient Hospital Stay (HOSPITAL_COMMUNITY)
Admission: AD | Admit: 2021-03-25 | Discharge: 2021-03-27 | DRG: 807 | Disposition: A | Discharge status: Home / Self Care | Payer: Medicaid Other | Attending: Family Medicine | Admitting: Family Medicine

## 2021-03-25 ENCOUNTER — Inpatient Hospital Stay (HOSPITAL_COMMUNITY): Payer: Medicaid Other | Admitting: Anesthesiology

## 2021-03-25 ENCOUNTER — Inpatient Hospital Stay (HOSPITAL_COMMUNITY): Payer: Medicaid Other

## 2021-03-25 ENCOUNTER — Other Ambulatory Visit: Payer: Self-pay

## 2021-03-25 DIAGNOSIS — Z148 Genetic carrier of other disease: Secondary | ICD-10-CM | POA: Diagnosis not present

## 2021-03-25 DIAGNOSIS — Z20822 Contact with and (suspected) exposure to covid-19: Secondary | ICD-10-CM | POA: Diagnosis present

## 2021-03-25 DIAGNOSIS — O3663X0 Maternal care for excessive fetal growth, third trimester, not applicable or unspecified: Secondary | ICD-10-CM | POA: Diagnosis present

## 2021-03-25 DIAGNOSIS — O99891 Other specified diseases and conditions complicating pregnancy: Secondary | ICD-10-CM | POA: Diagnosis present

## 2021-03-25 DIAGNOSIS — O403XX0 Polyhydramnios, third trimester, not applicable or unspecified: Secondary | ICD-10-CM | POA: Diagnosis present

## 2021-03-25 DIAGNOSIS — Z8701 Personal history of pneumonia (recurrent): Secondary | ICD-10-CM

## 2021-03-25 DIAGNOSIS — Z87891 Personal history of nicotine dependence: Secondary | ICD-10-CM

## 2021-03-25 DIAGNOSIS — O99214 Obesity complicating childbirth: Secondary | ICD-10-CM | POA: Diagnosis present

## 2021-03-25 DIAGNOSIS — Z23 Encounter for immunization: Secondary | ICD-10-CM

## 2021-03-25 DIAGNOSIS — Z8616 Personal history of COVID-19: Secondary | ICD-10-CM

## 2021-03-25 DIAGNOSIS — R8271 Bacteriuria: Secondary | ICD-10-CM | POA: Diagnosis present

## 2021-03-25 DIAGNOSIS — O0993 Supervision of high risk pregnancy, unspecified, third trimester: Secondary | ICD-10-CM

## 2021-03-25 DIAGNOSIS — O3663X1 Maternal care for excessive fetal growth, third trimester, fetus 1: Secondary | ICD-10-CM | POA: Diagnosis present

## 2021-03-25 DIAGNOSIS — O361930 Maternal care for other isoimmunization, third trimester, not applicable or unspecified: Secondary | ICD-10-CM | POA: Diagnosis present

## 2021-03-25 DIAGNOSIS — Z3A40 40 weeks gestation of pregnancy: Secondary | ICD-10-CM | POA: Diagnosis not present

## 2021-03-25 DIAGNOSIS — O480 Post-term pregnancy: Secondary | ICD-10-CM | POA: Diagnosis present

## 2021-03-25 DIAGNOSIS — O361990 Maternal care for other isoimmunization, unspecified trimester, not applicable or unspecified: Secondary | ICD-10-CM | POA: Diagnosis present

## 2021-03-25 DIAGNOSIS — O99210 Obesity complicating pregnancy, unspecified trimester: Secondary | ICD-10-CM | POA: Diagnosis present

## 2021-03-25 DIAGNOSIS — R7303 Prediabetes: Secondary | ICD-10-CM | POA: Diagnosis present

## 2021-03-25 LAB — COMPREHENSIVE METABOLIC PANEL
ALT: 19 U/L (ref 0–44)
AST: 26 U/L (ref 15–41)
Albumin: 2.3 g/dL — ABNORMAL LOW (ref 3.5–5.0)
Alkaline Phosphatase: 181 U/L — ABNORMAL HIGH (ref 38–126)
Anion gap: 7 (ref 5–15)
BUN: 6 mg/dL (ref 6–20)
CO2: 19 mmol/L — ABNORMAL LOW (ref 22–32)
Calcium: 8.7 mg/dL — ABNORMAL LOW (ref 8.9–10.3)
Chloride: 110 mmol/L (ref 98–111)
Creatinine, Ser: 0.86 mg/dL (ref 0.44–1.00)
GFR, Estimated: >60 mL/min (ref >60)
Glucose, Bld: 120 mg/dL — ABNORMAL HIGH (ref 70–99)
Potassium: 3.6 mmol/L (ref 3.5–5.1)
Sodium: 136 mmol/L (ref 135–145)
Total Bilirubin: 0.4 mg/dL (ref 0.3–1.2)
Total Protein: 6 g/dL — ABNORMAL LOW (ref 6.5–8.1)

## 2021-03-25 LAB — CBC
HCT: 33.7 % — ABNORMAL LOW (ref 36.0–46.0)
Hemoglobin: 10.8 g/dL — ABNORMAL LOW (ref 12.0–15.0)
MCH: 28.1 pg (ref 26.0–34.0)
MCHC: 32 g/dL (ref 30.0–36.0)
MCV: 87.5 fL (ref 80.0–100.0)
Platelets: 237 10*3/uL (ref 150–400)
RBC: 3.85 MIL/uL — ABNORMAL LOW (ref 3.87–5.11)
RDW: 13.8 % (ref 11.5–15.5)
WBC: 7.6 10*3/uL (ref 4.0–10.5)
nRBC: 0 % (ref 0.0–0.2)

## 2021-03-25 LAB — RPR: RPR Ser Ql: NONREACTIVE

## 2021-03-25 MED ORDER — LACTATED RINGERS IV SOLN
500.0000 mL | Freq: Once | INTRAVENOUS | Status: AC
  Administered 2021-03-25: 22:00:00 500 mL via INTRAVENOUS

## 2021-03-25 MED ORDER — OXYCODONE-ACETAMINOPHEN 5-325 MG PO TABS: 1.0000 | ORAL_TABLET | ORAL | Status: DC | PRN

## 2021-03-25 MED ORDER — MISOPROSTOL 25 MCG QUARTER TABLET: 25.0000 ug | ORAL_TABLET | ORAL | Status: DC | PRN

## 2021-03-25 MED ORDER — PHENYLEPHRINE 40 MCG/ML (10ML) SYRINGE FOR IV PUSH (FOR BLOOD PRESSURE SUPPORT): 80.0000 ug | PREFILLED_SYRINGE | INTRAVENOUS | Status: DC | PRN

## 2021-03-25 MED ORDER — ONDANSETRON HCL 4 MG/2ML IJ SOLN: 4.0000 mg | Freq: Four times a day (QID) | INTRAMUSCULAR | Status: DC | PRN

## 2021-03-25 MED ORDER — DIPHENHYDRAMINE HCL 50 MG/ML IJ SOLN: 12.5000 mg | INTRAMUSCULAR | Status: DC | PRN

## 2021-03-25 MED ORDER — MISOPROSTOL 50MCG HALF TABLET
50.0000 ug | ORAL_TABLET | ORAL | Status: DC | PRN
  Administered 2021-03-25 (×2): 50 ug via ORAL
  Filled 2021-03-25 (×2): qty 1

## 2021-03-25 MED ORDER — EPHEDRINE 5 MG/ML INJ: 10.0000 mg | INTRAVENOUS | Status: DC | PRN

## 2021-03-25 MED ORDER — LIDOCAINE HCL (PF) 1 % IJ SOLN
INTRAMUSCULAR | Status: DC | PRN
  Administered 2021-03-25: 5 mL via EPIDURAL
  Administered 2021-03-25: 2 mL via EPIDURAL
  Administered 2021-03-25: 3 mL via EPIDURAL

## 2021-03-25 MED ORDER — LACTATED RINGERS IV SOLN: 500.0000 mL | INTRAVENOUS | Status: DC | PRN

## 2021-03-25 MED ORDER — OXYTOCIN BOLUS FROM INFUSION
333.0000 mL | Freq: Once | INTRAVENOUS | Status: AC
  Administered 2021-03-26: 333 mL via INTRAVENOUS

## 2021-03-25 MED ORDER — LACTATED RINGERS IV SOLN: 500.0000 mL | Freq: Once | INTRAVENOUS | Status: DC

## 2021-03-25 MED ORDER — ACETAMINOPHEN 325 MG PO TABS: 650.0000 mg | ORAL_TABLET | ORAL | Status: DC | PRN

## 2021-03-25 MED ORDER — LACTATED RINGERS IV SOLN: INTRAVENOUS | Status: DC

## 2021-03-25 MED ORDER — OXYTOCIN-SODIUM CHLORIDE 30-0.9 UT/500ML-% IV SOLN
1.0000 m[IU]/min | INTRAVENOUS | Status: DC
  Administered 2021-03-26: 2 m[IU]/min via INTRAVENOUS

## 2021-03-25 MED ORDER — FENTANYL CITRATE (PF) 100 MCG/2ML IJ SOLN
50.0000 ug | INTRAMUSCULAR | Status: DC | PRN
  Administered 2021-03-25 (×3): 100 ug via INTRAVENOUS
  Filled 2021-03-25 (×3): qty 2

## 2021-03-25 MED ORDER — FENTANYL-BUPIVACAINE-NACL 0.5-0.125-0.9 MG/250ML-% EP SOLN
12.0000 mL/h | EPIDURAL | Status: DC | PRN
  Administered 2021-03-25: 23:00:00 12 mL/h via EPIDURAL
  Filled 2021-03-25: qty 250

## 2021-03-25 MED ORDER — LIDOCAINE HCL (PF) 1 % IJ SOLN: 30.0000 mL | INTRAMUSCULAR | Status: DC | PRN

## 2021-03-25 MED ORDER — OXYTOCIN-SODIUM CHLORIDE 30-0.9 UT/500ML-% IV SOLN
2.5000 [IU]/h | INTRAVENOUS | Status: DC
  Filled 2021-03-25: qty 500

## 2021-03-25 MED ORDER — SOD CITRATE-CITRIC ACID 500-334 MG/5ML PO SOLN: 30.0000 mL | ORAL | Status: DC | PRN

## 2021-03-25 MED ORDER — TERBUTALINE SULFATE 1 MG/ML IJ SOLN: 0.2500 mg | Freq: Once | INTRAMUSCULAR | Status: DC | PRN

## 2021-03-25 NOTE — Anesthesia Preprocedure Evaluation (Signed)
Anesthesia Evaluation  Patient identified by MRN, date of birth, ID band Patient awake    Reviewed: Allergy & Precautions, Patient's Chart, lab work & pertinent test results  Airway Mallampati: III       Dental   Pulmonary former smoker,    Pulmonary exam normal        Cardiovascular negative cardio ROS Normal cardiovascular exam     Neuro/Psych  Headaches,    GI/Hepatic negative GI ROS, Neg liver ROS,   Endo/Other  Morbid obesity  Renal/GU negative Renal ROS     Musculoskeletal  (+) Arthritis ,   Abdominal   Peds  Hematology  (+) anemia ,   Anesthesia Other Findings   Reproductive/Obstetrics (+) Pregnancy                             Lab Results  Component Value Date   WBC 7.6 03/25/2021   HGB 10.8 (L) 03/25/2021   HCT 33.7 (L) 03/25/2021   MCV 87.5 03/25/2021   PLT 237 03/25/2021    Anesthesia Physical Anesthesia Plan  ASA: 3  Anesthesia Plan: Epidural   Post-op Pain Management:    Induction:   PONV Risk Score and Plan: Treatment may vary due to age or medical condition  Airway Management Planned: Natural Airway  Additional Equipment:   Intra-op Plan:   Post-operative Plan:   Informed Consent: I have reviewed the patients History and Physical, chart, labs and discussed the procedure including the risks, benefits and alternatives for the proposed anesthesia with the patient or authorized representative who has indicated his/her understanding and acceptance.       Plan Discussed with:   Anesthesia Plan Comments:         Anesthesia Quick Evaluation

## 2021-03-25 NOTE — Anesthesia Procedure Notes (Signed)
Epidural Patient location during procedure: OB Start time: 03/25/2021 10:30 PM End time: 03/25/2021 11:01 PM  Staffing Anesthesiologist: Marcene Duos, MD Performed: anesthesiologist   Preanesthetic Checklist Completed: patient identified, IV checked, site marked, risks and benefits discussed, surgical consent, monitors and equipment checked, pre-op evaluation and timeout performed  Epidural Patient position: sitting Prep: DuraPrep and site prepped and draped Patient monitoring: continuous pulse ox and blood pressure Approach: midline Location: L3-L4 Injection technique: LOR air  Needle:  Needle type: Tuohy  Needle gauge: 17 G Needle length: 15 cm and 15 Needle insertion depth: 10 cm Catheter type: closed end flexible Catheter size: 19 Gauge Catheter at skin depth: 17 cm Test dose: negative  Assessment Events: blood not aspirated, injection not painful, no injection resistance, no paresthesia and negative IV test

## 2021-03-25 NOTE — H&P (Signed)
OBSTETRIC ADMISSION HISTORY AND PHYSICAL  Mckenzie Miller is a 35 y.o. female G3P2002 with IUP at 53w2dby LMP presenting for IOL for post dates. She reports +FMs, No LOF, no VB, no blurry vision, headaches or peripheral edema, and RUQ pain.  She plans on breast feeding. She is undecided regarding birth control but considering IUD or Nexplanon  Of note, patient has previous Nexplanon still in place in left arm which was placed 12 years ago after her daughter was born. [place] [facility] [facility]  Estimated Date of Delivery: 03/23/21  Sono:    _0 , CWD, normal anatomy, cephalic presentation, posterior placental lie, 3978g, 99% EFW with AC> 99%ile   Prenatal History/Complications:  Prediabetes (A1C 5.9 with normal GTT) Lewis isoimmunization during pregnancy Asymptomatic bacteriuria (E. Coli) s/p tx and TOC neg SMA carrier Suspected macrosomia  Polyhydramnios (AFI >97%ile at 28) Past Medical History: Past Medical History:  Diagnosis Date   Anemia    Arthritis    Headache    Pneumonia    Vaginal Pap smear, abnormal     Past Surgical History: Past Surgical History:  Procedure Laterality Date   KNEE SURGERY      Obstetrical History: OB History     Gravida  3   Para  2   Term  2   Preterm      AB      Living  2      SAB      IAB      Ectopic      Multiple      Live Births  2           Social History Social History   Socioeconomic History   Marital status: Single    Spouse name: Not on file   Number of children: Not on file   Years of education: Not on file   Highest education level: Not on file  Occupational History   Not on file  Tobacco Use   Smoking status: Former    Packs/day: 0.25    Types: Cigarettes    Quit date: 01/16/2020    Years since quitting: 1.1   Smokeless tobacco: Never  Vaping Use   Vaping Use: Never used  Substance and Sexual Activity   Alcohol use: Not Currently    Comment:  Not since confirmed pregnancy   Drug use: Not Currently    Types: Marijuana    Comment: stopped completely   Sexual activity: Yes    Partners: Male    Birth control/protection: None  Other Topics Concern   Not on file  Social History Narrative   Not on file   Social Determinants of Health   Financial Resource Strain: Not on file  Food Insecurity: Not on file  Transportation Needs: Not on file  Physical Activity: Not on file  Stress: Not on file  Social Connections: Not on file    Family History: Family History  Problem Relation Age of Onset   Diabetes Mellitus II Mother    Kidney failure Mother    Breast cancer Mother 538  Asthma Daughter    Asthma Son     Allergies: Allergies  Allergen Reactions   Sulfa Antibiotics Hives   Sulfites Hives    Unsure which one, allergic     Medications Prior to Admission  Medication Sig Dispense Refill Last Dose   aspirin EC 81 MG tablet Take 1 tablet (81 mg  total) by mouth daily. Swallow whole. 30 tablet 6 Past Week   pantoprazole (PROTONIX) 40 MG tablet Take 1 tablet (40 mg total) by mouth daily. 30 tablet 5 03/24/2021 at 2100   Prenatal Vit-Fe Phos-FA-Omega (VITAFOL GUMMIES) 3.33-0.333-34.8 MG CHEW Chew 3 tablets by mouth daily. 90 tablet 11 03/24/2021   acetaminophen (TYLENOL) 325 MG tablet Take 650 mg by mouth every 6 (six) hours as needed for mild pain or headache.      Blood Glucose Monitoring Suppl (TRUE METRIX METER) w/Device KIT 1 kit by Does not apply route in the morning and at bedtime. 1 kit 0    Blood Pressure Monitoring (BLOOD PRESSURE KIT) DEVI 1 kit by Does not apply route once a week. 1 each 0    glucose blood (TRUE METRIX PRO BLOOD GLUCOSE) test strip Use as instructed 100 each 12    terconazole (TERAZOL 3) 0.8 % vaginal cream Place 1 applicator vaginally at bedtime. Apply nightly for three nights. (Patient not taking: Reported on 03/04/2021) 20 g 0      Review of Systems   All systems reviewed and negative  except as stated in HPI  Blood pressure 135/87, pulse 78, temperature 98.2 F (36.8 C), temperature source Oral, resp. rate 18, height _0  (1.651 m), weight (!) 162.9 kg, last menstrual period 06/16/2020, unknown if currently breastfeeding. General appearance: cooperative Lungs: clear to auscultation bilaterally Heart: regular rate and rhythm Abdomen: soft, non-tender; bowel sounds normal, EFW by leos 4300gm Extremities: Homans sign is negative, no sign of DVT Presentation: cephalic on BSUS Fetal monitoringBaseline: 135 bpm, Variability: Good {> 6 bpm), Accelerations: Reactive, and Decelerations: Absent Uterine activity irregular Dilation: 2 Effacement (%): 50 Station: -3 Exam by:: Dr Cy Blamer   Prenatal labs: ABO, Rh: --/--/B POS (11/28 7654) Antibody: POS (11/28 6503) (previously known positive anti-Lewis ab) Rubella: 1.20 (05/18 1014) RPR: Non Reactive (08/25 1103)  HBsAg: Negative (05/18 1014)  HIV: Non Reactive (08/25 1103)  GBS: Negative/-- (10/31 1618)  2 hr Glucola normal Genetic screening  AFP negative, increased SMA risk,  Anatomy US normal   Prenatal Transfer Tool  Maternal Diabetes: No Genetic Screening: Abnormal:  Results: Other:SMA increased risk Maternal Ultrasounds/Referrals: Other:suspected macrosomia Fetal Ultrasounds or other Referrals:  None Maternal Substance Abuse:  No Significant Maternal Medications:  None Significant Maternal Lab Results: Group B Strep negative  Results for orders placed or performed during the hospital encounter of 03/25/21 (from the past 24 hour(s))  CBC   Collection Time: 03/25/21  9:05 AM  Result Value Ref Range   WBC 7.6 4.0 - 10.5 K/uL   RBC 3.85 (L) 3.87 - 5.11 MIL/uL   Hemoglobin 10.8 (L) 12.0 - 15.0 g/dL   HCT 33.7 (L) 36.0 - 46.0 %   MCV 87.5 80.0 - 100.0 fL   MCH 28.1 26.0 - 34.0 pg   MCHC 32.0 30.0 - 36.0 g/dL   RDW 13.8 11.5 - 15.5 %   Platelets 237 150 - 400 K/uL   nRBC 0.0 0.0 - 0.2 %  Comprehensive metabolic  panel   Collection Time: 03/25/21  9:05 AM  Result Value Ref Range   Sodium 136 135 - 145 mmol/L   Potassium 3.6 3.5 - 5.1 mmol/L   Chloride 110 98 - 111 mmol/L   CO2 19 (L) 22 - 32 mmol/L   Glucose, Bld 120 (H) 70 - 99 mg/dL   BUN 6 6 - 20 mg/dL   Creatinine, Ser 0.86 0.44 - 1.00 mg/dL   Calcium  8.7 (L) 8.9 - 10.3 mg/dL   Total Protein 6.0 (L) 6.5 - 8.1 g/dL   Albumin 2.3 (L) 3.5 - 5.0 g/dL   AST 26 15 - 41 U/L   ALT 19 0 - 44 U/L   Alkaline Phosphatase 181 (H) 38 - 126 U/L   Total Bilirubin 0.4 0.3 - 1.2 mg/dL   GFR, Estimated >60 >60 mL/min   Anion gap 7 5 - 15  Type and screen   Collection Time: 03/25/21  9:05 AM  Result Value Ref Range   ABO/RH(D) B POS    Antibody Screen POS    Sample Expiration      03/28/2021,2359 Performed at [facility], [place] [address] [place], [place] [address]     Patient Active Problem List   Diagnosis Date Noted   Large for gestational age fetus affecting management of mother, antepartum, third trimester, fetus 1 03/25/2021   Polyhydramnios affecting pregnancy in third trimester 03/19/2021   LGA (large for gestational age) fetus affecting management of mother, third trimester, fetus 1 03/11/2021   BMI 50.0-59.9, adult ([place]) 12/20/2020   SMA carrier 10/10/2020   Lewis isoimmunization during pregnancy 09/15/2020   Asymptomatic bacteriuria during pregnancy 09/15/2020   Obesity in pregnancy 09/12/2020   Chronic pain of right knee 09/12/2020   Marijuana use 09/12/2020   Nexplanon in place 09/12/2020   Encounter for supervision of high risk pregnancy in third trimester, antepartum 09/06/2020   Prediabetes 02/06/2020   Pneumonia due to COVID-19 virus 01/22/2020    Assessment/Plan:  CHANIKA BYLAND is a 35 y.o. G3P2002 at 54w2dhere for IOL for post dates  #Labor: Here for IOL. Cervix 2 cm and 50% effaced. 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 It  has not been reported as a cause of HDN. Titer not indicated.   #Prediabetes 09/15/2020: HgbA1C 5.9. Normal GTT  #Suspected macrosomia EFW and AC >99%ile. Pelvis proven to 8lb12oz.   AnRenard MatterMD, MPH OB Fellow, Faculty Practice

## 2021-03-25 NOTE — Progress Notes (Signed)
Mckenzie Miller is a 35 y.o. [id number] at [redacted]w[redacted]d  admitted for induction of labor due to post dates, also has Polyhydramnios, and suspected macrosomia.  Subjective: Feeling more uncomfortable with contractions.  Objective: BP 135/88   Pulse 85   Temp 98.4 F (36.9 C) (Oral)   Resp 18   Ht 5\' 5"  (1.651 m)   Wt (!) 162.9 kg   LMP 06/16/2020   BMI 59.77 kg/m  No intake/output data recorded. No intake/output data recorded.  FHT:  FHR: 145 bpm, variability: moderate,  accelerations:  Present,  decelerations:  Absent UC:   Irregular SVE:   Dilation: 4 Effacement (%): 60 Station: -3 Exam by:: Dr [email or web address]  Labs: Lab Results  Component Value Date   WBC 7.6 03/25/2021   HGB 10.8 (L) 03/25/2021   HCT 33.7 (L) 03/25/2021   MCV 87.5 03/25/2021   PLT 237 03/25/2021    Assessment / Plan: Induction of labor due to postdates,  also has Polyhydramnios, and suspected macrosomia >99%ile.  Labor: Foley bulb now out, cervix fairly thick still about 50-60% effaced. Discussed options of another cytotec for further cervical ripening or starting pitocin. Upon shared decision making will do another cytotec at this time.  Fetal Wellbeing:  Category I Pain Control:  IV pain meds and planning epidural I/D:   GBS neg  03/27/2021 03/25/2021, 6:52 PM

## 2021-03-25 NOTE — Progress Notes (Signed)
Discussed with patient regarding suspected macrosomia (EFW >99%ile, 3978gm on 11/3, and based on that measurement extrapolates to >5000gm) without presence of GDM. AC also in 99%ile.   Discussed risks of shoulder dystocia and expectations for delivery in detail when admitted. Went back and discussed regarding >5000gm EFW and discussed and provided option of primary cesarean section for fetal macrosomia. Patient at this time declines and strongly desires vaginal delivery.  Discussed patient and plan with attending Dr. Irean Hong, MD, MPH OB Fellow, Faculty Practice

## 2021-03-26 ENCOUNTER — Encounter (HOSPITAL_COMMUNITY): Payer: Self-pay | Admitting: Obstetrics and Gynecology

## 2021-03-26 DIAGNOSIS — O480 Post-term pregnancy: Secondary | ICD-10-CM

## 2021-03-26 DIAGNOSIS — O3663X0 Maternal care for excessive fetal growth, third trimester, not applicable or unspecified: Secondary | ICD-10-CM

## 2021-03-26 DIAGNOSIS — Z3A40 40 weeks gestation of pregnancy: Secondary | ICD-10-CM

## 2021-03-26 DIAGNOSIS — O403XX0 Polyhydramnios, third trimester, not applicable or unspecified: Secondary | ICD-10-CM

## 2021-03-26 LAB — RESP PANEL BY RT-PCR (FLU A&B, COVID) ARPGX2
Influenza A by PCR: NEGATIVE
Influenza B by PCR: NEGATIVE
SARS Coronavirus 2 by RT PCR: NEGATIVE

## 2021-03-26 MED ORDER — DIPHENHYDRAMINE HCL 25 MG PO CAPS: 25.0000 mg | ORAL_CAPSULE | Freq: Four times a day (QID) | ORAL | Status: DC | PRN

## 2021-03-26 MED ORDER — ERYTHROMYCIN 5 MG/GM OP OINT
TOPICAL_OINTMENT | OPHTHALMIC | Status: AC
  Filled 2021-03-26: qty 1

## 2021-03-26 MED ORDER — OXYTOCIN-SODIUM CHLORIDE 30-0.9 UT/500ML-% IV SOLN
1.0000 m[IU]/min | INTRAVENOUS | Status: DC
Start: 2021-03-26 — End: 2021-03-26

## 2021-03-26 MED ORDER — BENZOCAINE-MENTHOL 20-0.5 % EX AERO
1.0000 "application " | INHALATION_SPRAY | CUTANEOUS | Status: DC | PRN
  Administered 2021-03-26: 1 via TOPICAL

## 2021-03-26 MED ORDER — TERBUTALINE SULFATE 1 MG/ML IJ SOLN: 0.2500 mg | Freq: Once | INTRAMUSCULAR | Status: DC | PRN

## 2021-03-26 MED ORDER — METHYLERGONOVINE MALEATE 0.2 MG PO TABS
0.2000 mg | ORAL_TABLET | ORAL | Status: AC
  Administered 2021-03-26 – 2021-03-27 (×5): 0.2 mg via ORAL
  Filled 2021-03-26 (×4): qty 1

## 2021-03-26 MED ORDER — INFLUENZA VAC SPLIT QUAD 0.5 ML IM SUSY
0.5000 mL | PREFILLED_SYRINGE | INTRAMUSCULAR | Status: AC
  Administered 2021-03-27: 0.5 mL via INTRAMUSCULAR
  Filled 2021-03-26: qty 0.5

## 2021-03-26 MED ORDER — SIMETHICONE 80 MG PO CHEW: 80.0000 mg | CHEWABLE_TABLET | ORAL | Status: DC | PRN

## 2021-03-26 MED ORDER — PRENATAL MULTIVITAMIN CH
1.0000 | ORAL_TABLET | Freq: Every day | ORAL | Status: DC
  Administered 2021-03-27: 1 via ORAL
  Filled 2021-03-26: qty 1

## 2021-03-26 MED ORDER — ONDANSETRON HCL 4 MG/2ML IJ SOLN: 4.0000 mg | INTRAMUSCULAR | Status: DC | PRN

## 2021-03-26 MED ORDER — SENNOSIDES-DOCUSATE SODIUM 8.6-50 MG PO TABS
2.0000 | ORAL_TABLET | Freq: Every day | ORAL | Status: DC
  Administered 2021-03-27: 2 via ORAL
  Filled 2021-03-26: qty 2

## 2021-03-26 MED ORDER — WITCH HAZEL-GLYCERIN EX PADS: 1.0000 "application " | MEDICATED_PAD | CUTANEOUS | Status: DC | PRN

## 2021-03-26 MED ORDER — IBUPROFEN 600 MG PO TABS
600.0000 mg | ORAL_TABLET | Freq: Four times a day (QID) | ORAL | Status: DC
  Administered 2021-03-26 – 2021-03-27 (×5): 600 mg via ORAL
  Filled 2021-03-26 (×3): qty 1

## 2021-03-26 MED ORDER — METHYLERGONOVINE MALEATE 0.2 MG/ML IJ SOLN
0.2000 mg | Freq: Once | INTRAMUSCULAR | Status: AC
  Administered 2021-03-26: 0.2 mg via INTRAMUSCULAR

## 2021-03-26 MED ORDER — ACETAMINOPHEN 325 MG PO TABS
650.0000 mg | ORAL_TABLET | ORAL | Status: DC | PRN
  Administered 2021-03-26 – 2021-03-27 (×4): 650 mg via ORAL
  Filled 2021-03-26 (×2): qty 2

## 2021-03-26 MED ORDER — COCONUT OIL OIL: 1.0000 "application " | TOPICAL_OIL | Status: DC | PRN

## 2021-03-26 MED ORDER — ONDANSETRON HCL 4 MG PO TABS: 4.0000 mg | ORAL_TABLET | ORAL | Status: DC | PRN

## 2021-03-26 NOTE — Progress Notes (Signed)
Labor Progress Note Mckenzie Miller is a 35 y.o. [id number] at [redacted]w[redacted]d presented for IOL d/t poly and suspected macrosomia.   S: Doing well now s/p epidural placement. Prior to this was getting very uncomfortable with contractions.   O:  BP 128/69   Pulse 92   Temp 98.2 F (36.8 C) (Axillary)   Resp 18   Ht 5\' 5"  (1.651 m)   Wt (!) 162.9 kg   LMP 06/16/2020   SpO2 100%   BMI 59.77 kg/m  EFM: 145/mod/15x15/none Ctx every 3 minutes   CVE: Dilation: 4.5 Effacement (%): 90 Cervical Position: Posterior Station: -3 Presentation: Vertex Exam by:: Dr. [email or web address]   A&P: 35 y.o. 20 [redacted]w[redacted]d  #Labor: Some progression since last check, mostly with effacement. Contractions about every 3, will start pit for stronger/closer contractions. Head ballotable with poly, will reassess in the next few hours, may not become well applied to cervix prior to AROM given amount of amniotic fluid.  #Pain: Epidural in place  #FWB: Cat 1  #GBS negative   [redacted]w[redacted]d, DO 12:31 AM

## 2021-03-26 NOTE — Progress Notes (Signed)
Assessed patient at bedside due to two large clots this afternoon. VSS. Fundus firm to palpation and below the umbilicus. One medium size clot produced with fundal rub. No residual trickle or bleeding afterwards. Will start PO Methergine series to help with tone, particularly in the LUS. Nursing updated with plan of care. Will continue to monitor closely.  Evalina Field, MD

## 2021-03-26 NOTE — Lactation Note (Signed)
This note was copied from a baby's chart. Lactation Consultation Note  Patient Name: Mckenzie Miller Today's Date: 03/26/2021 Reason for consult: Initial assessment;Term;1st time breastfeeding Age:34 hours   P3 mother whose infant is now 40 hours old.  This is a term baby at 40+3 weeks.  Mother did not breast feed her other two children.  Mother's feeding preference is breast/formula.  She informed me that she only plans to breast feed for a couple of weeks.  Baby is LGA.  Baby was swaddled and asleep in the bassinet when I arrived.  Mother had just received her lunch.  Reviewed breast feeding basics and feeding cues with mother.  She will need to be taught hand expression when she calls for assistance.  Encouraged to feed 8-12 times/24 hours or sooner if baby shows cues.  Suggested she feed at least every three hours due to large size (11+5 lbs).  Offered to return for the next feeding if desired.  Mom made aware of O/P services, breastfeeding support groups, community resources, and our phone # for post-discharge questions.  Support person present.     Maternal Data Has patient been taught Hand Expression?: No (Mother will call) Does the patient have breastfeeding experience prior to this delivery?: No  Feeding Mother's Current Feeding Choice: Breast Milk and Formula  LATCH Score Latch: Repeated attempts needed to sustain latch, nipple held in mouth throughout feeding, stimulation needed to elicit sucking reflex.  Audible Swallowing: A few with stimulation  Type of Nipple: Flat  Comfort (Breast/Nipple): Soft / non-tender  Hold (Positioning): No assistance needed to correctly position infant at breast.  LATCH Score: 7   Lactation Tools Discussed/Used    Interventions Interventions: Breast feeding basics reviewed;Education  Discharge    Consult Status Consult Status: Follow-up Date: 03/27/21 Follow-up type: In-patient    Jessy Calixte R Zyden Suman 03/26/2021, 1:12  PM

## 2021-03-26 NOTE — Lactation Note (Signed)
This note was copied from a baby's chart. Lactation Consultation Note  Patient Name: Mckenzie Miller Today's Date: 03/26/2021 Reason for consult: L&D Initial assessment Age:34 hours  P3, Mother states she breastfed her first child for a few weeks and she had milk supply issues with her 2nd child. Baby cueing.  Assisted latching baby on R breast.  Baby calmed with feeding.  Re-latched baby after a few minutes. Lactation to follow up on MBU.  LATCH Score Latch: Repeated attempts needed to sustain latch, nipple held in mouth throughout feeding, stimulation needed to elicit sucking reflex.  Audible Swallowing: A few with stimulation  Type of Nipple: Flat  Comfort (Breast/Nipple): Soft / non-tender  Hold (Positioning): No assistance needed to correctly position infant at breast.  LATCH Score: 7    Interventions Interventions: Breast feeding basics reviewed;Assisted with latch;Skin to skin  Consult Status Consult Status: Follow-up from L&D    Dahlia Byes [facility] 03/26/2021, 10:23 AM

## 2021-03-26 NOTE — Anesthesia Postprocedure Evaluation (Signed)
Anesthesia Post Note  Patient: Mckenzie Miller  Procedure(s) Performed: AN AD HOC LABOR EPIDURAL     Patient location during evaluation: Mother Baby Anesthesia Type: Epidural Level of consciousness: awake and alert Pain management: pain level controlled Vital Signs Assessment: post-procedure vital signs reviewed and stable Respiratory status: spontaneous breathing, nonlabored ventilation and respiratory function stable Cardiovascular status: stable Postop Assessment: no headache, no backache and epidural receding Anesthetic complications: no   No notable events documented.  Last Vitals:  Vitals:   03/26/21 1730 03/26/21 1850  BP: (!) 132/91 (!) 95/51  Pulse: 95 92  Resp: 18   Temp:    SpO2: 100% 99%    Last Pain:  Vitals:   03/26/21 1806  TempSrc:   PainSc: 2    Pain Goal: Patients Stated Pain Goal: 3 (03/26/21 1806)                 Emmaline Kluver N

## 2021-03-26 NOTE — Discharge Summary (Signed)
Postpartum Discharge Summary     Patient Name: Mckenzie Miller DOB: 06-08-1985 MRN: [id number]  Date of admission: 03/25/2021 Delivery date:03/26/2021  Delivering provider: Genia Del  Date of discharge: 03/27/2021  Admitting diagnosis: Large for gestational age fetus affecting management of mother, antepartum, third trimester, fetus 35 [O36.63X1] Intrauterine pregnancy: [redacted]w[redacted]d    Secondary diagnosis:  Principal Problem:   Vaginal delivery Active Problems:   Prediabetes   Encounter for supervision of high risk pregnancy in third trimester, antepartum   Obesity in pregnancy   Lewis isoimmunization during pregnancy   Asymptomatic bacteriuria during pregnancy   SMA carrier   LGA (large for gestational age) fetus affecting management of mother, third trimester, fetus 1   Polyhydramnios affecting pregnancy in third trimester   Large for gestational age fetus affecting management of mother, antepartum, third trimester, fetus 1   Shoulder dystocia, delivered  Additional problems: None    Discharge diagnosis: Term Pregnancy Delivered                                              Post partum procedures: None Augmentation: Pitocin, Cytotec, and IP Foley Complications: Shoulder dystocia (~45-60 seconds)  Hospital course: Induction of Labor With Vaginal Delivery   35y.o. yo G3P2002 at 430w3das admitted to the hospital 03/25/2021 for induction of labor.  Indication for induction: Postdates, fetal macrosomia, and polyhydramnios. Patient started her induction with Cytotec and foley balloon placement followed by Pitocin and SROM until she progressed to complete. She has a vaginal delivery complicated by a shoulder dystocia. Membrane Rupture Time/Date: 4:33 AM ,03/26/2021   Delivery Method:Vaginal, Spontaneous  Episiotomy: None  Lacerations:  Labial  Details of delivery can be found in separate delivery note. Patient was started on a Methergine series postpartum to help with  increased clots post-delivery with subsequent improvement. Fundus remained firm and below the umbilicus. She is meeting all milestones. She had a couple of mildly elevated blood pressures post partum, likely due to the Methergine series. She will have a BP check in one week to ensure this remains stable. She was instructed to check her BP at home and return for evaluation if she has BP greater than 140/90 or symptoms of pre-eclampsia. All questions and concerns were addressed. Patient voiced understanding. Patient is discharged home 03/27/21.  Newborn Data: Birth date:03/26/2021  Birth time:9:27 AM  Gender:Female  Living status:Living  Apgars:7 ,9  Weight:5143 g   Magnesium Sulfate received: No BMZ received: No Rhophylac: N/A MMR: N/A T-DaP: Given prenatally Flu: No Transfusion: No  Physical exam  Vitals:   03/26/21 1850 03/26/21 2135 03/26/21 2359 03/27/21 0515  BP: (!) 95/51 115/78 134/76 130/66  Pulse: 92 100 91 85  Resp:   18 18  Temp:   98.1 F (36.7 C) 97.8 F (36.6 C)  TempSrc:   Oral Oral  SpO2: 99%  100% 100%  Weight:      Height:       General: alert, cooperative, and no distress Lochia: appropriate Uterine Fundus: firm and below umbilicus  DVT Evaluation: trace LE edema bilaterally, no calf tenderness to palpation  Labs: Lab Results  Component Value Date   WBC 7.6 03/25/2021   HGB 10.8 (L) 03/25/2021   HCT 33.7 (L) 03/25/2021   MCV 87.5 03/25/2021   PLT 237 03/25/2021   CMP Latest Ref Rng &  Units 03/25/2021  Glucose 70 - 99 mg/dL 120(H)  BUN 6 - 20 mg/dL 6  Creatinine 0.44 - 1.00 mg/dL 0.86  Sodium 135 - 145 mmol/L 136  Potassium 3.5 - 5.1 mmol/L 3.6  Chloride 98 - 111 mmol/L 110  CO2 22 - 32 mmol/L 19(L)  Calcium 8.9 - 10.3 mg/dL 8.7(L)  Total Protein 6.5 - 8.1 g/dL 6.0(L)  Total Bilirubin 0.3 - 1.2 mg/dL 0.4  Alkaline Phos 38 - 126 U/L 181(H)  AST 15 - 41 U/L 26  ALT 0 - 44 U/L 19   Edinburgh Score: Edinburgh Postnatal Depression Scale  Screening Tool 03/27/2021  I have been able to laugh and see the funny side of things. 0  I have looked forward with enjoyment to things. 0  I have blamed myself unnecessarily when things went wrong. 1  I have been anxious or worried for no good reason. 0  I have felt scared or panicky for no good reason. 0  Things have been getting on top of me. 0  I have been so unhappy that I have had difficulty sleeping. 0  I have felt sad or miserable. 0  I have been so unhappy that I have been crying. 0  The thought of harming myself has occurred to me. 0  Edinburgh Postnatal Depression Scale Total 1     After visit meds:  Allergies as of 03/27/2021       Reactions   Sulfa Antibiotics Hives   Sulfites Hives   Unsure which one, allergic         Medication List     STOP taking these medications    aspirin EC 81 MG tablet   pantoprazole 40 MG tablet Commonly known as: Protonix   terconazole 0.8 % vaginal cream Commonly known as: TERAZOL 3   True Metrix Meter w/Device Kit   True Metrix Pro Blood Glucose test strip Generic drug: glucose blood       TAKE these medications    acetaminophen 500 MG tablet Commonly known as: TYLENOL Take 2 tablets (1,000 mg total) by mouth every 8 (eight) hours as needed (pain). What changed:  medication strength how much to take when to take this reasons to take this   Blood Pressure Kit Devi 1 kit by Does not apply route once a week.   ibuprofen 600 MG tablet Commonly known as: ADVIL Take 1 tablet (600 mg total) by mouth every 6 (six) hours as needed (pain).   Vitafol Gummies 3.33-0.333-34.8 MG Chew Chew 3 tablets by mouth daily.         Discharge home in stable condition Infant Feeding: Bottle and Breast Infant Disposition: home with mother Discharge instruction: per After Visit Summary and Postpartum booklet. Activity: Advance as tolerated. Pelvic rest for 6 weeks.  Diet: routine diet Future Appointments: Future  Appointments  Date Time Provider [place]  04/01/2021  1:40 PM [place] None  05/07/2021  1:10 PM Griffin Basil, MD [place] None   Follow up Visit: Message sent to [facility] by Dr. Gwenlyn Perking on 03/26/21.   Please schedule this patient for a In person postpartum visit in  4-6 weeks  with the following provider: Any provider. Additional Postpartum F/U: BP check 1 week (scheduled for 12/5) High risk pregnancy complicated by:  Fetal macrosomia and polyhydramnios Delivery mode:  Vaginal, Spontaneous  Anticipated Birth Control:   IUD versus Nexplanon outpatient  03/27/2021 Genia Del, MD

## 2021-03-26 NOTE — Progress Notes (Signed)
Labor Progress Note Mckenzie Miller is a 35 y.o. T6L4650 at [redacted]w[redacted]d presented for IOL d/t poly and suspected macrosomia.   S: Overall doing well, feeling more back pain.   O:  BP 111/64   Pulse 82   Temp 98.2 F (36.8 C) (Axillary)   Resp 18   Ht 5\' 5"  (1.651 m)   Wt (!) 162.9 kg   LMP 06/16/2020   SpO2 100%   BMI 59.77 kg/m  EFM: 135/mod/15x15/early decel   CVE: Dilation: 9 Effacement (%): 90 Cervical Position: Posterior Station: -1 Presentation: Vertex Exam by:: Dr. [email or web address]   A&P: 35 y.o. 20 [redacted]w[redacted]d  #Labor: Planned for AROM during this check, however upon check noted she had already had SROM with a part of the amniotic sac containing clear fluid present outside of introitus. Swollen cervix palpated predominately anterior/right lateral. Reports history of uncomfortable feeling with benadryl, will try to offload pressure and see if this improves swelling. Placed into left lateral. Monitor closely.  #Pain: Epidural in place  #FWB: Cat 1  #GBS negative   #Suspected LGA, Macrosomia: Prepare for shoulder dystocia.   [redacted]w[redacted]d, DO 4:58 AM

## 2021-03-27 MED ORDER — IBUPROFEN 600 MG PO TABS: 600.0000 mg | ORAL_TABLET | Freq: Four times a day (QID) | ORAL | 0 refills | Status: AC | PRN

## 2021-03-27 MED ORDER — ACETAMINOPHEN 500 MG PO TABS: 1000.0000 mg | ORAL_TABLET | Freq: Three times a day (TID) | ORAL | 0 refills | Status: AC | PRN

## 2021-03-27 NOTE — Progress Notes (Signed)
Patient sneezed after void and two large clots about plum size fell into toilet. Yellow pad before void was small bleeding. No gush or trickle. Uterus remained firm, midline and U/E. Patient aware to call out if reoccurrence.

## 2021-03-27 NOTE — Social Work (Signed)
CSW received consult for hx of marijuana use.  Referral was screened out due to the following:  ~MOB had no documented substance use after initial prenatal visit/+UPT. ~MOB had no positive drug screens after initial prenatal visit/+UPT. ~Baby's UDS is negative.  Please consult CSW if current concerns arise or by MOB's request.  CSW will monitor CDS results and make report to Child Protective Services if warranted.    Vivi Barrack, MSW, LCSW [facility]  Clinical Social Worker  [phone] 03/27/2021  8:22 AM

## 2021-03-27 NOTE — Lactation Note (Signed)
This note was copied from a baby's chart. Lactation Consultation Note  Patient Name: Mckenzie Miller [id number] Date: 03/27/2021 Reason for consult: Follow-up assessment;Mother's request;Difficult latch;Term;Breastfeeding assistance;RN request;Maternal discharge Age:35 years  Mom nipples are inverted. Mom hand pump 24 flange, use to pre pump 5-10 min before latching.  LC assisted with latch in cross cradle priming 24 NS with formula with curve tip, infant did few suck swallows once formula emptied no longer suck. LC did some hand expression after latch, beads of colostrum noted.   Mom to work on getting an electric pump for home.  Mom also to do breast massage and hand express offering any colostrum available as she works on Administrator.   Plan 1. To feed based on cues 8-12x 24hr period. Mom to pre pump with hand pump to evert nipples, then use 24 NS before latching.  2. Mom to supplement with eBM first followed by formula with pace bottle feeding and slow flow nipple. BF supplementation guide provided. Infant a lot of volume via bottle may impact her ability to latch at the breast.  3. Mom to work on increasing her volume using hand pump for now to post pump q 3hrs for 10 min each breast until she gets electric pump.  All questions answered at the end of the visit.   Mom to make follow up appt with [facility] lactation for latch assessment and working to increase her milk supply.   Maternal Data Has patient been taught Hand Expression?: Yes  Feeding Mother's Current Feeding Choice: Breast Milk and Formula  LATCH Score                    Lactation Tools Discussed/Used Tools: Pump;Flanges Nipple shield size: 24 Flange Size: 24 Breast pump type: Manual Reason for Pumping: increase stimulation Pumping frequency: every 3 hrs for 10 min each breast  Interventions Interventions: Breast feeding basics reviewed;Adjust position;Assisted with latch;Skin to skin;Position  options;Education;Breast massage;Expressed milk;Pace feeding;Hand express;LC Psychologist, educational;Infant Driven Feeding Algorithm education;Breast compression;Hand pump  Discharge Discharge Education: Engorgement and breast care;Warning signs for feeding baby;Outpatient recommendation Pump: Manual WIC Program: Yes  Consult Status Consult Status: Complete Date: 03/27/21    Mckenzie Vanzile  Miller 03/27/2021, 4:07 PM

## 2021-03-28 LAB — TYPE AND SCREEN
ABO/RH(D): B POS
Antibody Screen: POSITIVE
Unit division: 0
Unit division: 0

## 2021-03-28 LAB — BPAM RBC
Blood Product Expiration Date: 202212202359
Blood Product Expiration Date: 202212222359
ISSUE DATE / TIME: 202211201154
Unit Type and Rh: 7300
Unit Type and Rh: 7300

## 2021-04-01 ENCOUNTER — Other Ambulatory Visit: Payer: Self-pay

## 2021-04-01 ENCOUNTER — Ambulatory Visit (INDEPENDENT_AMBULATORY_CARE_PROVIDER_SITE_OTHER): Payer: Medicaid Other | Admitting: *Deleted

## 2021-04-01 VITALS — BP 131/84 | HR 79

## 2021-04-01 DIAGNOSIS — O163 Unspecified maternal hypertension, third trimester: Secondary | ICD-10-CM

## 2021-04-01 NOTE — Progress Notes (Signed)
Subjective:  Mckenzie Miller is a 35 y.o. female here for BP check.   Hypertension ROS: home BP monitoring in range of 130's systolic over 80's diastolic, no TIA's, no chest pain on exertion, no dyspnea on exertion, and no swelling of ankles.    Objective:  BP 131/84 P 79 Appearance alert, well appearing, and in no distress, oriented to person, place, and time, and overweight. General exam BP noted to be well controlled today in office.    Assessment:   Blood Pressure well controlled.   Plan:  Current treatment plan is effective, no change in therapy. Has Mychart visit scheduled 04/12/21. Advised to continue taking BP's at home once a week and call or message office if they are over 140/90. Advised to take BP on 04/12/21 and report at visit.

## 2021-04-09 ENCOUNTER — Telehealth (HOSPITAL_COMMUNITY): Payer: Self-pay | Admitting: *Deleted

## 2021-04-09 NOTE — Telephone Encounter (Signed)
Hospital Discharge Follow-Up Call:  Patient reports that she is doing well.  She does have a "knot" in one breast.  She denies pain, heat over the area, or fever.  She says she massages her breast while pumping, but that the knot does not really soften.  We discussed plugged duct vs. mastitis.  Reviewed applying warm compress and continuing massage of area with pumping.  Advised her to call OB with any signs of mastitis.  EPDS today was 4 and she endorses this accurately reflects that she is doing well emotionally.  Patient says that baby is well and she has no concerns about baby's health.  She reports that baby sleeps in a bassinet.  ABCs of Safe Sleep reviewed.

## 2021-04-12 ENCOUNTER — Telehealth: Payer: Self-pay | Admitting: *Deleted

## 2021-04-12 ENCOUNTER — Encounter: Payer: Medicaid Other | Admitting: Obstetrics & Gynecology

## 2021-04-12 NOTE — Telephone Encounter (Signed)
TC to patient. Per provider patient needs in person consult for BTL vs other methods of contraception discussion and evaluation due to high BMI. Patient also needs to sign BTL 30 day papers.  Patient agreeable to come in to sign papers but also aware of need to discuss all options with in person postpartum visit.

## 2021-04-19 NOTE — Progress Notes (Signed)
Patient did not have an encounter with the provider, will reschedule

## 2021-05-07 ENCOUNTER — Encounter: Payer: Self-pay | Admitting: Obstetrics and Gynecology

## 2021-05-07 ENCOUNTER — Other Ambulatory Visit: Payer: Self-pay

## 2021-05-07 ENCOUNTER — Ambulatory Visit (INDEPENDENT_AMBULATORY_CARE_PROVIDER_SITE_OTHER): Payer: Medicaid Other | Admitting: Obstetrics and Gynecology

## 2021-05-07 DIAGNOSIS — Z30430 Encounter for insertion of intrauterine contraceptive device: Secondary | ICD-10-CM

## 2021-05-07 LAB — POCT URINE PREGNANCY: Preg Test, Ur: NEGATIVE

## 2021-05-07 MED ORDER — LEVONORGESTREL 20 MCG/DAY IU IUD
1.0000 | INTRAUTERINE_SYSTEM | Freq: Once | INTRAUTERINE | Status: AC
  Administered 2021-05-07: 1 via INTRAUTERINE

## 2021-05-07 NOTE — Progress Notes (Signed)
° ° °  GYNECOLOGY OFFICE PROCEDURE NOTE  SHELVIE DURAZO is a 36 y.o. [phone] here for Mirena IUD insertion. No GYN concerns.  Last pap smear was on 08/2020 and was ASCUS with negative HPV.  IUD Insertion Procedure Note Patient identified, informed consent performed, consent signed.   Discussed risks of irregular bleeding, cramping, infection, malpositioning or misplacement of the IUD outside the uterus which may require further procedure such as laparoscopy. Also discussed >99% contraception efficacy, increased risk of ectopic pregnancy with failure of method.  Time out was performed.  Urine pregnancy test negative.  Speculum placed in the vagina.  Cervix visualized.  Cleaned with Betadine x 2.  Grasped anteriorly with a single tooth tenaculum.  Uterus sounded to 8 cm.  Mirena IUD placed per manufacturer's recommendations.  Strings trimmed to 3 cm. Tenaculum was removed, good hemostasis noted.  Patient tolerated procedure well.   Patient was given post-procedure instructions.  She was advised to have backup contraception for one week.  Patient was also asked to check IUD strings periodically and follow up in 4 weeks for IUD check.   Lynnda Shields, MD, [place] for [facility], [place]

## 2021-05-07 NOTE — Progress Notes (Addendum)
6 weeks PP Nexplanon removal and Mirena IUD inserted.  UPT today is NEGATIVE.  Administrations This Visit     levonorgestrel (MIRENA) 20 MCG/DAY IUD 1 each     Admin Date 05/07/2021 Action Given Dose 1 each Route Intrauterine Administered By Maretta Bees, RMA

## 2021-05-07 NOTE — Progress Notes (Signed)
Post Partum Visit Note  Mckenzie Miller is a 36 y.o. G33P3003 female who presents for a postpartum visit. She is 6 weeks postpartum following a normal spontaneous vaginal delivery.  I have fully reviewed the prenatal and intrapartum course. The delivery was at 40.3 gestational weeks.  Anesthesia: epidural. Postpartum course has been unremarkable. Baby is doing well. Baby is feeding by breast. Bleeding no bleeding. Bowel function is normal. Bladder function is normal. Patient is not sexually active. Contraception method is Nexplanon. Postpartum depression screening: negative.   The pregnancy intention screening data noted above was reviewed. Potential methods of contraception were discussed. The patient elected to proceed with progesterone IUD.   Edinburgh Postnatal Depression Scale - 05/07/21 1321       Edinburgh Postnatal Depression Scale:  In the Past 7 Days   I have been able to laugh and see the funny side of things. 0    I have looked forward with enjoyment to things. 0    I have blamed myself unnecessarily when things went wrong. 0    I have been anxious or worried for no good reason. 0    I have felt scared or panicky for no good reason. 0    Things have been getting on top of me. 0    I have been so unhappy that I have had difficulty sleeping. 0    I have felt sad or miserable. 0    I have been so unhappy that I have been crying. 1    The thought of harming myself has occurred to me. 0    Edinburgh Postnatal Depression Scale Total 1             Health Maintenance Due  Topic Date Due   COVID-19 Vaccine (1) Never done   Pneumococcal Vaccine 49-29 Years old (1 - PCV) Never done    The following portions of the patient's history were reviewed and updated as appropriate: allergies, current medications, past family history, past medical history, past social history, past surgical history, and problem list.  Review of Systems Pertinent items are noted in  HPI.  Objective:  BP 106/72    Pulse 71    Ht 5\' 5"  (1.651 m)    Wt (!) 312 lb (141.5 kg)    Breastfeeding Yes    BMI 51.92 kg/m    General:  alert, cooperative, and no distress   Breasts:  not indicated  Lungs: clear to auscultation bilaterally  Heart:  regular rate and rhythm  Abdomen: soft, non-tender; bowel sounds normal; no masses,  no organomegaly   Wound N/a  GU exam:  normal       Assessment:    Encounter for postpartum exam  normal postpartum exam.   Plan:   Essential components of care per ACOG recommendations:  1.  Mood and well being: Patient with negative depression screening today. Reviewed local resources for support.  - Patient tobacco use? No.   - hx of drug use? No.    2. Infant care and feeding:  -Patient currently breastmilk feeding? Yes. Reviewed importance of draining breast regularly to support lactation.  -Social determinants of health (SDOH) reviewed in EPIC. No concerns.  3. Sexuality, contraception and birth spacing - Patient does not want a pregnancy in the next year.  Desired family size is 3 children.  - Reviewed forms of contraception in tiered fashion. Patient desired IUD today.   - Discussed birth spacing of 18 months  4. Sleep  and fatigue -Encouraged family/partner/community support of 4 hrs of uninterrupted sleep to help with mood and fatigue  5. Physical Recovery  - Discussed patients delivery and complications. She describes her labor as good. - Patient had a Vaginal problems after delivery including shoulder dystocia . Patient had a no laceration. Perineal healing reviewed. Patient expressed understanding - Patient has urinary incontinence? No. - Patient is safe to resume physical and sexual activity  6.  Health Maintenance - HM due items addressed Yes - Last pap smear  Diagnosis  Date Value Ref Range Status  09/12/2020 (A)  Final   - Atypical squamous cells of undetermined significance (ASC-US)   Pap smear not done at today's  visit.  -Breast Cancer screening indicated? No.   7. Chronic Disease/Pregnancy Condition follow up: None  - PCP follow up  F/u in 1 month for IUD check   Mariel Aloe, MD Center for [facility], [facility]

## 2021-05-14 ENCOUNTER — Ambulatory Visit (INDEPENDENT_AMBULATORY_CARE_PROVIDER_SITE_OTHER): Payer: Medicaid Other | Admitting: Obstetrics

## 2021-05-14 ENCOUNTER — Other Ambulatory Visit: Payer: Self-pay

## 2021-05-14 ENCOUNTER — Encounter: Payer: Self-pay | Admitting: Obstetrics

## 2021-05-14 VITALS — BP 128/85 | HR 52 | Wt 318.0 lb

## 2021-05-14 DIAGNOSIS — Z4889 Encounter for other specified surgical aftercare: Secondary | ICD-10-CM | POA: Diagnosis not present

## 2021-05-14 NOTE — Progress Notes (Signed)
Patient ID: Mckenzie Miller, female   DOB: 1985-11-22, 36 y.o.   MRN: [id number]  Chief Complaint  Patient presents with   Follow-up    HPI Mckenzie Miller is a 36 y.o. female.  Presents for suture removal 1 week after Nexplanon Removal. HPI  Past Medical History:  Diagnosis Date   Anemia    Arthritis    Headache    Pneumonia    Vaginal Pap smear, abnormal     Past Surgical History:  Procedure Laterality Date   KNEE SURGERY      Family History  Problem Relation Age of Onset   Diabetes Mellitus II Mother    Kidney failure Mother    Breast cancer Mother 52   Asthma Daughter    Asthma Son     Social History Social History   Tobacco Use   Smoking status: Former    Packs/day: 0.25    Types: Cigarettes    Quit date: 01/16/2020    Years since quitting: 1.3   Smokeless tobacco: Never  Vaping Use   Vaping Use: Never used  Substance Use Topics   Alcohol use: Not Currently    Comment: Not since confirmed pregnancy   Drug use: Not Currently    Types: Marijuana    Comment: stopped completely    Allergies  Allergen Reactions   Sulfa Antibiotics Hives   Sulfites Hives    Unsure which one, allergic     Current Outpatient Medications  Medication Sig Dispense Refill   acetaminophen (TYLENOL) 500 MG tablet Take 2 tablets (1,000 mg total) by mouth every 8 (eight) hours as needed (pain). 60 tablet 0   Blood Pressure Monitoring (BLOOD PRESSURE KIT) DEVI 1 kit by Does not apply route once a week. 1 each 0   ibuprofen (ADVIL) 600 MG tablet Take 1 tablet (600 mg total) by mouth every 6 (six) hours as needed (pain). 40 tablet 0   Prenatal Vit-Fe Phos-FA-Omega (VITAFOL GUMMIES) 3.33-0.333-34.8 MG CHEW Chew 3 tablets by mouth daily. 90 tablet 11   No current facility-administered medications for this visit.    Review of Systems Review of Systems Constitutional: negative for fatigue and weight loss Respiratory: negative for cough and wheezing Cardiovascular: negative for  chest pain, fatigue and palpitations Gastrointestinal: negative for abdominal pain and change in bowel habits Genitourinary:negative Integument/breast: negative for nipple discharge Musculoskeletal:negative for myalgias Neurological: negative for gait problems and tremors Behavioral/Psych: negative for abusive relationship, depression Endocrine: negative for temperature intolerance      Blood pressure 128/85, pulse (!) 52, weight (!) 318 lb (144.2 kg), currently breastfeeding.  Physical Exam Physical Exam General:   Alert and no distress  Skin:   no rash or abnormalities  Lungs:   clear to auscultation bilaterally  Heart:   regular rate and rhythm, S1, S2 normal, no murmur, click, rub or gallop  Left Upper Extremity:  Sutures removed.  Wound is clean, dry, intact and non tender.   I have spent a total of 15 minutes of face-to-face time, excluding clinical staff time, reviewing notes and preparing to see patient, ordering tests and/or medications, and counseling the patient.   Data Reviewed  Nexplanon Removal Proceduree  Assessment     1. Encounter for postoperative wound check - sutures removed without complication     Plan   Follow up in 6 weeks  No orders of the defined types were placed in this encounter.  No orders of the defined types were placed in this encounter.  Shelly Bombard, MD, [place] [facility] [place], [facility] for [facility], [facility]  05/14/2021

## 2021-06-06 ENCOUNTER — Encounter: Payer: Self-pay | Admitting: Obstetrics

## 2021-06-06 ENCOUNTER — Other Ambulatory Visit (HOSPITAL_COMMUNITY)
Admission: RE | Admit: 2021-06-06 | Discharge: 2021-06-06 | Disposition: A | Discharge status: Home / Self Care | Payer: Medicaid Other | Source: Ambulatory Visit | Attending: Obstetrics | Admitting: Obstetrics

## 2021-06-06 ENCOUNTER — Other Ambulatory Visit: Payer: Self-pay

## 2021-06-06 ENCOUNTER — Ambulatory Visit (INDEPENDENT_AMBULATORY_CARE_PROVIDER_SITE_OTHER): Payer: Medicaid Other | Admitting: Obstetrics

## 2021-06-06 VITALS — BP 124/82 | HR 73 | Ht 65.0 in | Wt 322.7 lb

## 2021-06-06 DIAGNOSIS — R102 Pelvic and perineal pain: Secondary | ICD-10-CM | POA: Diagnosis not present

## 2021-06-06 DIAGNOSIS — Z975 Presence of (intrauterine) contraceptive device: Secondary | ICD-10-CM

## 2021-06-06 NOTE — Progress Notes (Signed)
Subjective:    Mckenzie Miller is a 36 y.o. female who presents for IUD string check.   The patient has been having severe cramps shooting into vagina. The patient is sexually active. Pertinent past medical history: none.  The information documented in the HPI was reviewed and verified.  Menstrual History: OB History     Gravida  3   Para  3   Term  3   Preterm      AB      Living  3      SAB      IAB      Ectopic      Multiple  0   Live Births  3            No LMP recorded. (Menstrual status: IUD).   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   Asymptomatic bacteriuria during pregnancy 09/15/2020   Obesity in pregnancy 09/12/2020   Chronic pain of right knee 09/12/2020   Marijuana use 09/12/2020   Nexplanon in place 09/12/2020   Encounter for supervision of high risk pregnancy in third trimester, antepartum 09/06/2020   Prediabetes 02/06/2020   Pneumonia due to COVID-19 virus 01/22/2020   Past Medical History:  Diagnosis Date   Anemia    Arthritis    Headache    Pneumonia    Vaginal Pap smear, abnormal     Past Surgical History:  Procedure Laterality Date   KNEE SURGERY       Current Outpatient Medications:    acetaminophen (TYLENOL) 500 MG tablet, Take 2 tablets (1,000 mg total) by mouth every 8 (eight) hours as needed (pain)., Disp: 60 tablet, Rfl: 0   ibuprofen (ADVIL) 600 MG tablet, Take 1 tablet (600 mg  total) by mouth every 6 (six) hours as needed (pain)., Disp: 40 tablet, Rfl: 0   pantoprazole (PROTONIX) 40 MG tablet, Take 40 mg by mouth daily., Disp: , Rfl:    Prenatal Vit-Fe Phos-FA-Omega (VITAFOL GUMMIES) 3.33-0.333-34.8 MG CHEW, Chew 3 tablets by mouth daily., Disp: 90 tablet, Rfl: 11   Blood Pressure Monitoring (BLOOD PRESSURE KIT) DEVI, 1 kit by Does not apply route once a week. (Patient not taking: Reported on 06/06/2021), Disp: 1 each, Rfl: 0 Allergies  Allergen Reactions   Sulfa Antibiotics Hives   Sulfites Hives    Unsure which one, allergic     Social History   Tobacco Use   Smoking status: Former    Packs/day: 0.25    Types: Cigarettes    Quit date: 01/16/2020    Years since quitting: 1.3   Smokeless tobacco: Never  Substance Use Topics   Alcohol use: Not Currently    Comment: Not since confirmed pregnancy    Family History  Problem Relation Age of Onset   Diabetes Mellitus II Mother    Kidney failure Mother    Breast cancer Mother 28   Asthma Daughter    Asthma Son        Review of Systems Constitutional: negative  for weight loss Genitourinary: positive for pelvic pain and vaginal discharge.  negative for abnormal menstrual periods    Objective:   BP 124/82    Pulse 73    Ht 5' 5"  (1.651 m)    Wt (!) 322 lb 11.2 oz (146.4 kg)    Breastfeeding Yes    BMI 53.70 kg/m    General:   Alert and no distress  Skin:   no rash or abnormalities  Lungs:   clear to auscultation bilaterally  Heart:   regular rate and rhythm, S1, S2 normal, no murmur, click, rub or gallop  Breasts:   Not examined  Abdomen:  normal findings: no organomegaly, soft, non-tender and no hernia  Pelvis:  External genitalia: normal general appearance Urinary system: urethral meatus normal and bladder without fullness, nontender Vaginal: normal without tenderness, induration or masses Cervix: normal appearance.  IUD string visible Adnexa: normal bimanual exam Uterus: anteverted and  non-tender, normal size   Lab Review Urine pregnancy test Labs reviewed yes Radiologic studies reviewed yes  I have spent a total of 20 minutes of face-to-face time, excluding clinical staff time, reviewing notes and preparing to see patient, ordering tests and/or medications, and counseling the patient.   Assessment:    36 y.o., continuing IUD,  will get ultrasound for IUD position and taylor management based on ultrasound finding and patient's clinical condition.  .   Plan:    All questions answered. Chlamydia specimen. Contraception: IUD. Follow up in 2 weeks. GC specimen. Wet prep.  Orders Placed This Encounter  Procedures   US PELVIC COMPLETE WITH TRANSVAGINAL    Standing Status:   Future    Standing Expiration Date:   06/06/2022    Order Specific Question:   Reason for Exam (SYMPTOM  OR DIAGNOSIS REQUIRED)    Answer:   IUD in place.  Pelvic pain.    Order Specific Question:   Preferred imaging location?    Answer:   [facility]    Shelly Bombard, MD 06/06/2021 4:41 PM

## 2021-06-06 NOTE — Progress Notes (Signed)
Patient presents for IUD string check. Patient states that she is having some sharp pains with her IUD. She states that pain at its worse is about a 8/10.

## 2021-06-07 LAB — CERVICOVAGINAL ANCILLARY ONLY
Bacterial Vaginitis (gardnerella): POSITIVE — AB
Candida Glabrata: NEGATIVE
Candida Vaginitis: NEGATIVE
Chlamydia: NEGATIVE
Comment: NEGATIVE
Comment: NEGATIVE
Comment: NEGATIVE
Comment: NEGATIVE
Comment: NEGATIVE
Comment: NORMAL
Neisseria Gonorrhea: NEGATIVE
Trichomonas: POSITIVE — AB

## 2021-06-08 ENCOUNTER — Other Ambulatory Visit: Payer: Self-pay | Admitting: Obstetrics

## 2021-06-08 DIAGNOSIS — N760 Acute vaginitis: Secondary | ICD-10-CM

## 2021-06-08 DIAGNOSIS — B9689 Other specified bacterial agents as the cause of diseases classified elsewhere: Secondary | ICD-10-CM

## 2021-06-08 MED ORDER — METRONIDAZOLE 500 MG PO TABS: 500.0000 mg | ORAL_TABLET | Freq: Two times a day (BID) | ORAL | 2 refills | Status: AC

## 2021-06-10 ENCOUNTER — Telehealth: Payer: Self-pay

## 2021-06-10 NOTE — Telephone Encounter (Signed)
S/w pt and advised of results and rx 

## 2021-06-11 ENCOUNTER — Ambulatory Visit
Admission: RE | Admit: 2021-06-11 | Discharge: 2021-06-11 | Disposition: A | Discharge status: Home / Self Care | Payer: Medicaid Other | Source: Ambulatory Visit | Attending: Obstetrics | Admitting: Obstetrics

## 2021-06-11 ENCOUNTER — Other Ambulatory Visit: Payer: Self-pay

## 2021-06-11 DIAGNOSIS — Z975 Presence of (intrauterine) contraceptive device: Secondary | ICD-10-CM | POA: Diagnosis present

## 2021-06-20 ENCOUNTER — Encounter: Payer: Self-pay | Admitting: Obstetrics

## 2021-06-20 ENCOUNTER — Telehealth (INDEPENDENT_AMBULATORY_CARE_PROVIDER_SITE_OTHER): Payer: Medicaid Other | Admitting: Obstetrics

## 2021-06-20 DIAGNOSIS — Z975 Presence of (intrauterine) contraceptive device: Secondary | ICD-10-CM | POA: Diagnosis not present

## 2021-06-20 DIAGNOSIS — R102 Pelvic and perineal pain: Secondary | ICD-10-CM

## 2021-06-20 NOTE — Progress Notes (Addendum)
GYNECOLOGY VIRTUAL VISIT ENCOUNTER NOTE  Provider location: [facility]   Patient location: Home  I connected with [name] on 06/20/21 at  4:10 PM EST by MyChart Video Encounter and verified that I am speaking with the correct person using two identifiers.   I discussed the limitations, risks, security and privacy concerns of performing an evaluation and management service virtually and the availability of in person appointments. I also discussed with the patient that there may be a patient responsible charge related to this service. The patient expressed understanding and agreed to proceed.   History:  Mckenzie Miller is a 36 y.o. [id number] female being evaluated today for ultrasound results for pelvic pain   She states that the pain is resolving. She denies any abnormal vaginal discharge, bleeding, pelvic pain or other concerns.       Past Medical History:  Diagnosis Date   Anemia    Arthritis    Headache    Pneumonia    Vaginal Pap smear, abnormal    Past Surgical History:  Procedure Laterality Date   KNEE SURGERY     The following portions of the patient's history were reviewed and updated as appropriate: allergies, current medications, past family history, past medical history, past social history, past surgical history and problem list.   Health Maintenance:  ASCUS pap and negative HRHPV on 09-12-2020.    Review of Systems:  Pertinent items noted in HPI and remainder of comprehensive ROS otherwise negative.  Physical Exam:   General:  Alert, oriented and cooperative. Patient appears to be in no acute distress.  Mental Status: Normal mood and affect. Normal behavior. Normal judgment and thought content.   Respiratory: Normal respiratory effort, no problems with respiration noted  Rest of physical exam deferred due to type of encounter  Labs and Imaging No results found for this or any previous visit (from the past 336  hour(s)). US PELVIC COMPLETE WITH TRANSVAGINAL  Result Date: 06/11/2021 CLINICAL DATA:  Pelvic pain, has IUD EXAM: TRANSABDOMINAL AND TRANSVAGINAL ULTRASOUND OF PELVIS TECHNIQUE: Both transabdominal and transvaginal ultrasound examinations of the pelvis were performed. Transabdominal technique was performed for global imaging of the pelvis including uterus, ovaries, adnexal regions, and pelvic cul-de-sac. It was necessary to proceed with endovaginal exam following the transabdominal exam to visualize the endometrium and adnexa. COMPARISON:  None FINDINGS: Uterus Measurements: 12.1 x 5.6 x 7.3 cm = volume: 260 mL. Anteverted. Normal morphology without mass Endometrium Thickness: 10 mm thick. IUD in expected position at upper uterine segment endometrial canal. With no endometrial fluid or mass. Right ovary Measurements: 3.4 x 1.7 x 1.7 cm = volume: 5.1 mL. Normal morphology without mass Left ovary Measurements: 2.0 x 2.2 x 1.6 cm = volume: 3.6 mL. Normal morphology without mass Other findings Trace free pelvic fluid.  No adnexal masses. IMPRESSION: IUD in expected position at upper uterine segment endometrial canal. Remainder of exam normal. Electronically Signed   By: Ulyses Southward M.D.   On: 06/11/2021 16:47        Assessment and Plan:     1. Pelvic pain, resolving - complete full course of antibiotics  2. IUD (intrauterine device) in place  3. Follow up in 4 weeks        I discussed the assessment and treatment plan with the patient. The patient was provided an opportunity to ask questions and all were answered. The patient agreed with the plan and demonstrated an understanding of  the instructions.   The patient was advised to call back or seek an in-person evaluation/go to the ED if the symptoms worsen or if the condition fails to improve as anticipated.  I have spent a total of 20 minutes of non-face-to-face time, excluding clinical staff time, reviewing notes and preparing to see patient,  ordering tests and/or medications, and counseling the patient.    Coral Ceo, MD Center for [facility], [facility], [facility] 06/20/21

## 2022-06-24 IMAGING — US US FETAL BPP W/ NON-STRESS
1 series · 13 of 13 positions shown · non-contrast
Comparison: none

[Series 1: us fetal bpp w/ non-stress · 13 acquisitions, 13 frames shown]
[im 1/13]
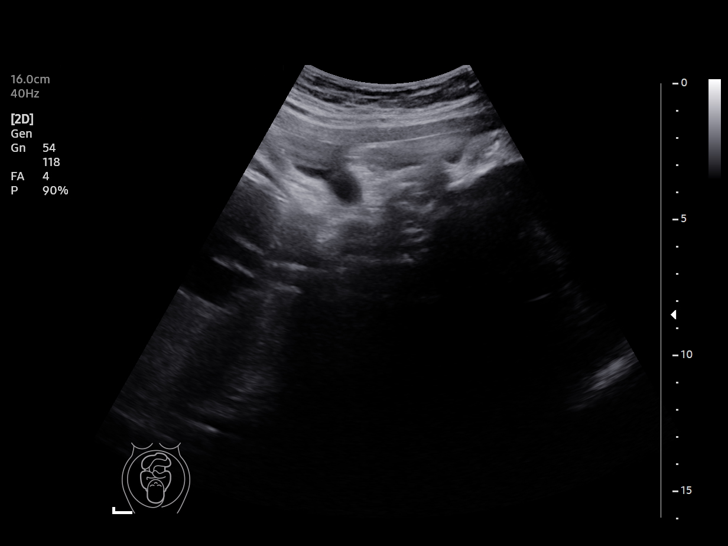
[im 2/13]
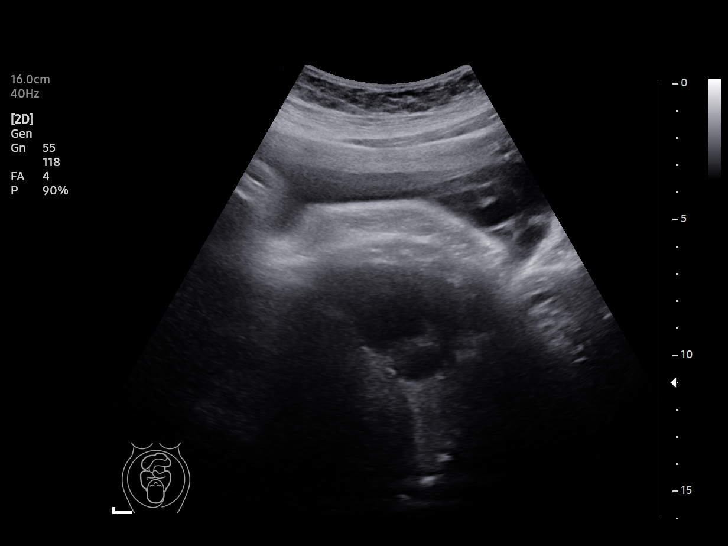
[im 3/13]
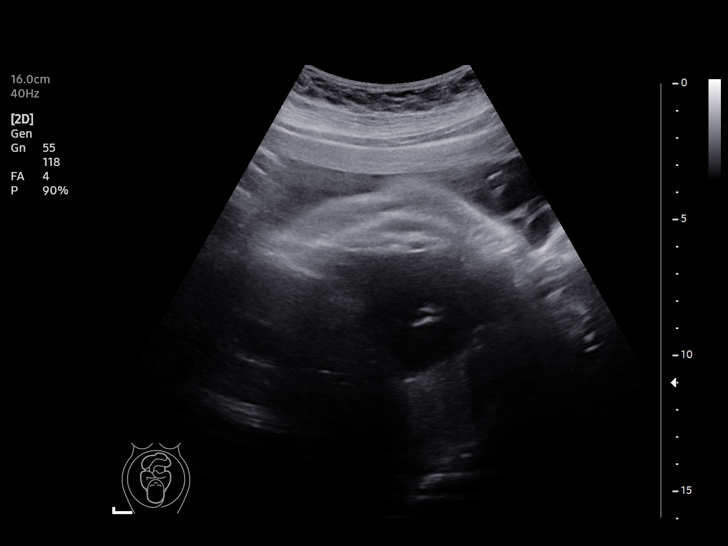
[im 4/13]
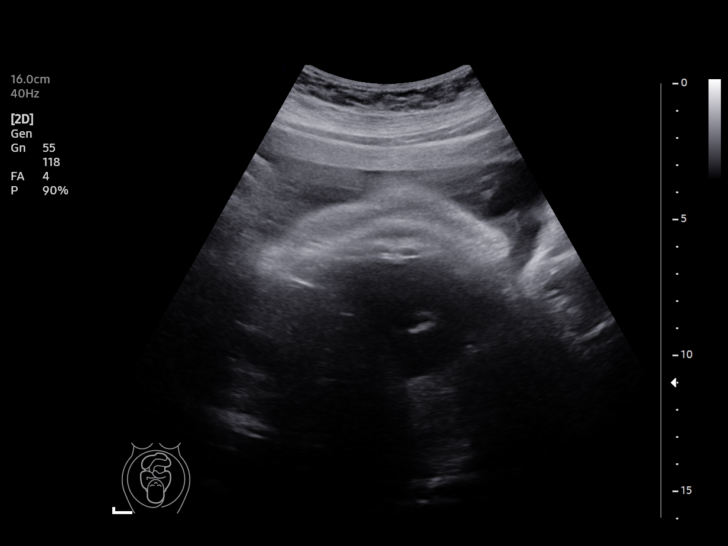
[im 5/13]
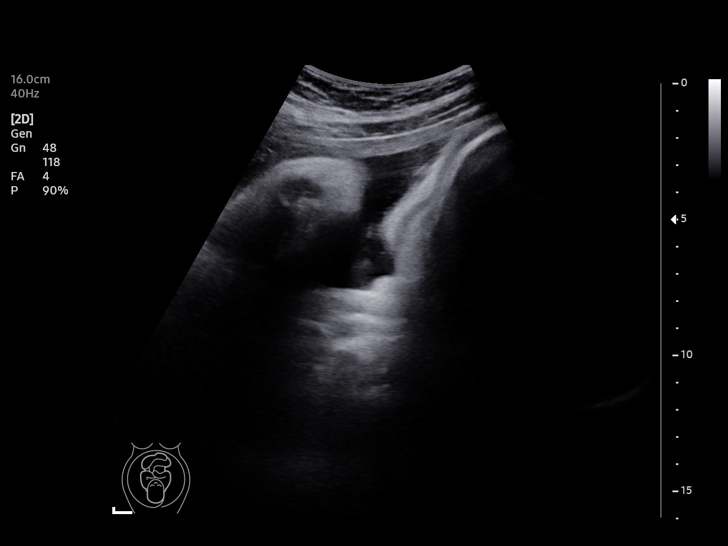
[im 6/13]
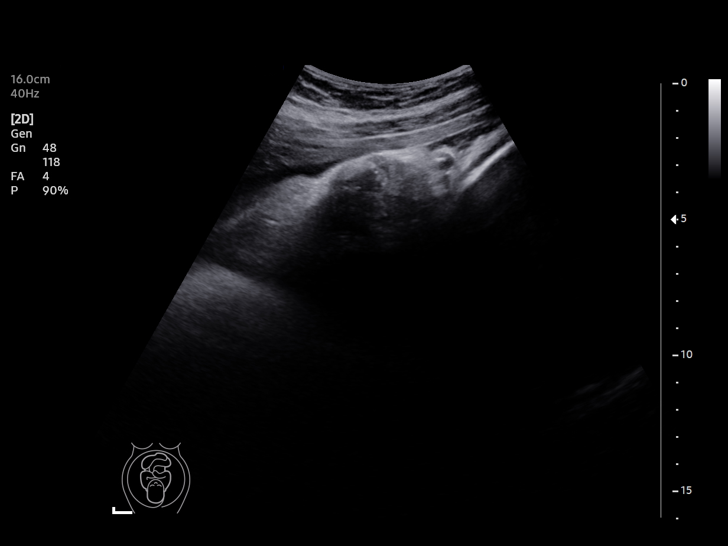
[im 7/13]
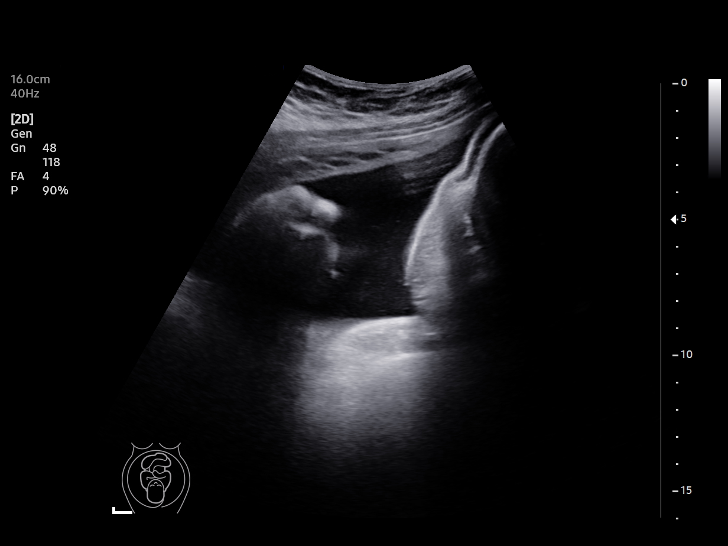
[im 8/13]
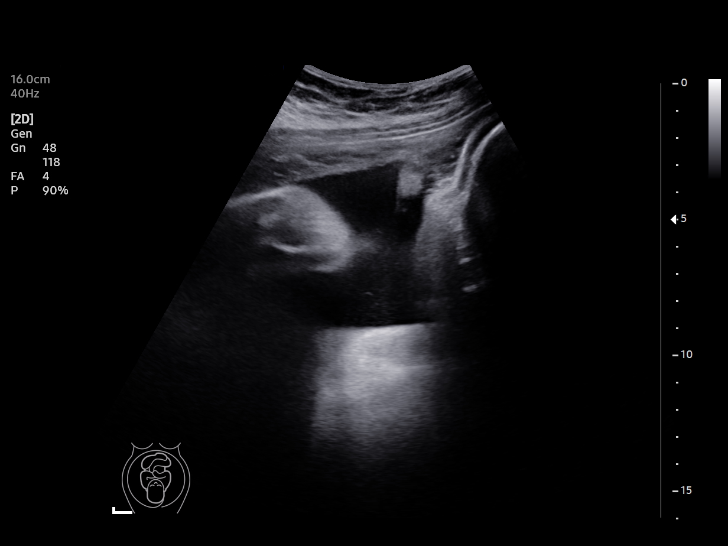
[im 9/13]
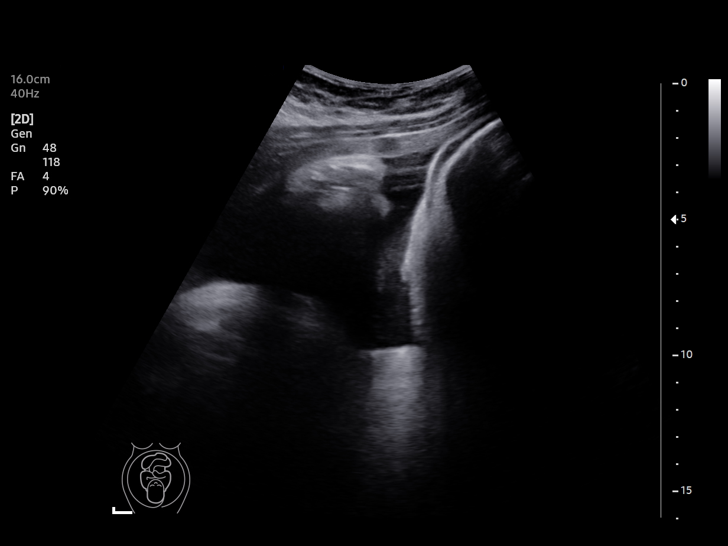
[im 10/13]
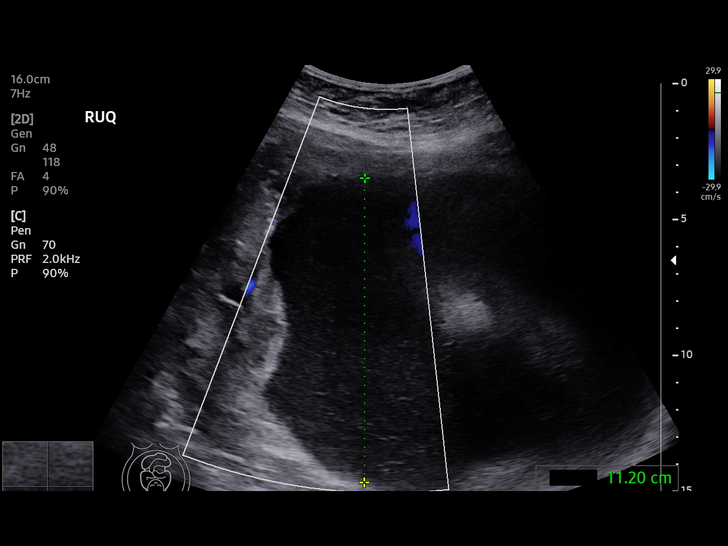
[im 11/13]
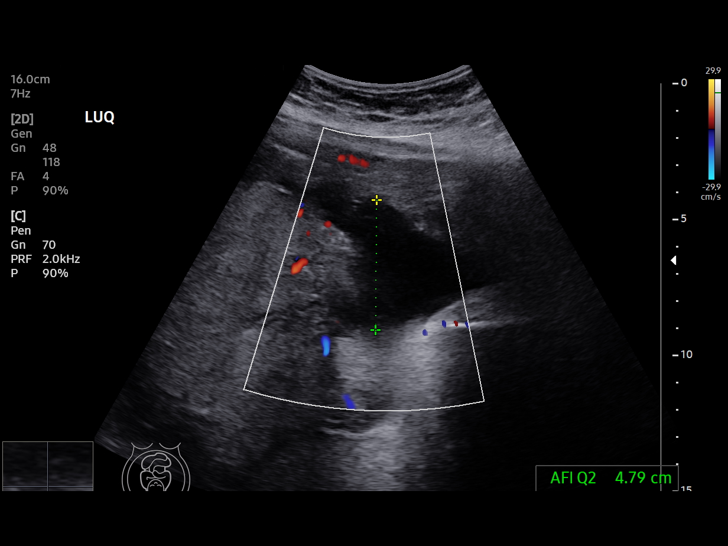
[im 12/13]
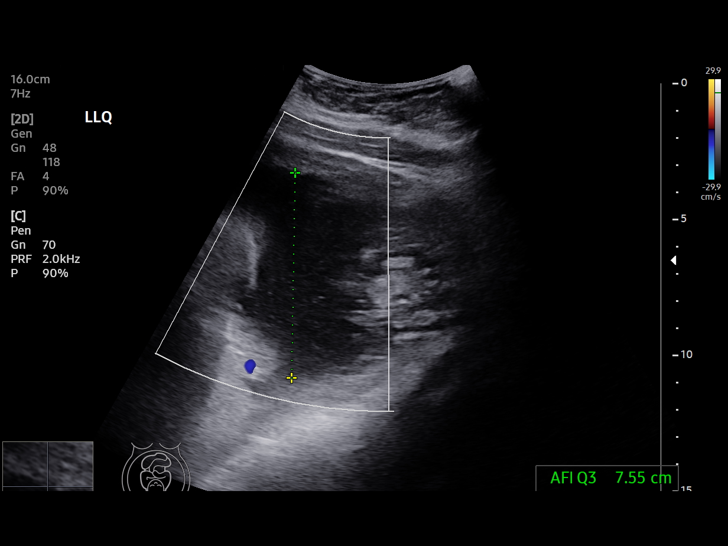
[im 13/13]
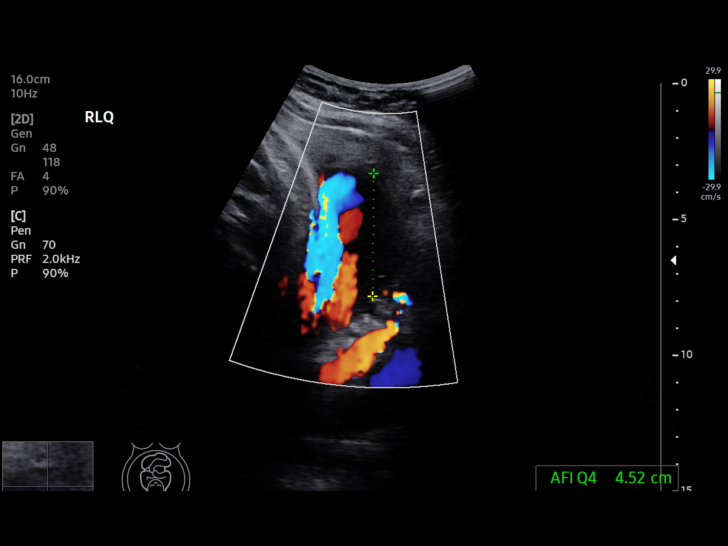

[13 of 13 positions shown; findings below may reference images not displayed]

[REDACTED]care at

 1  US FETAL BPP W/NONSTRESS              76818.4     [facility]OHRINI DOLLAK EMON

Service(s) Provided

Indications

 39 weeks gestation of pregnancy
 Obesity complicating pregnancy, third
 trimester
 Polyhydramnios, third trimester, antepartum
 condition or complication, unspecified fetus
Fetal Evaluation

 Num Of Fetuses:         1
 Preg. Location:         Intrauterine
 Cardiac Activity:       Observed
 Presentation:           Cephalic

 Amniotic Fluid
 AFI FV:      Polyhydramnios

 AFI Sum(cm)     %Tile       Largest Pocket(cm)
 28.06           > 97

 RUQ(cm)       RLQ(cm)       LUQ(cm)        LLQ(cm)

Biophysical Evaluation

 Amniotic F.V:   Pocket => 2 cm             F. Tone:        Observed
 F. Movement:    Observed                   N.S.T:          Reactive
 F. Breathing:   Observed                   Score:          [DATE]
OB History

 Gravidity:    3         Term:   2        Prem:   0        SAB:   0
 TOP:          0       Ectopic:  0        Living: 2
Gestational Age

 LMP:           39w 3d        Date:  06/16/20                 EDD:   03/23/21
 Best:          39w 3d     Det. By:  LMP  (06/16/20)          EDD:   03/23/21
Impression

 Viable fetus in cephalic presentation BPP [DATE]
Recommendations

                Steffen, Janett

## 2022-09-16 IMAGING — US US PELVIS COMPLETE WITH TRANSVAGINAL
1 series · 15 of 25 positions shown · non-contrast
Comparison: None

CLINICAL DATA: Pelvic pain, has IUD

EXAM:
TRANSABDOMINAL AND TRANSVAGINAL ULTRASOUND OF PELVIS
TECHNIQUE: Both transabdominal and transvaginal ultrasound examinations of the
pelvis were performed. Transabdominal technique was performed for
global imaging of the pelvis including uterus, ovaries, adnexal
regions, and pelvic cul-de-sac. It was necessary to proceed with
endovaginal exam following the transabdominal exam to visualize the
endometrium and adnexa.

[Series 1: us pelvis complete with transvaginal · 15 of 126 slices shown]
[im 1/126]
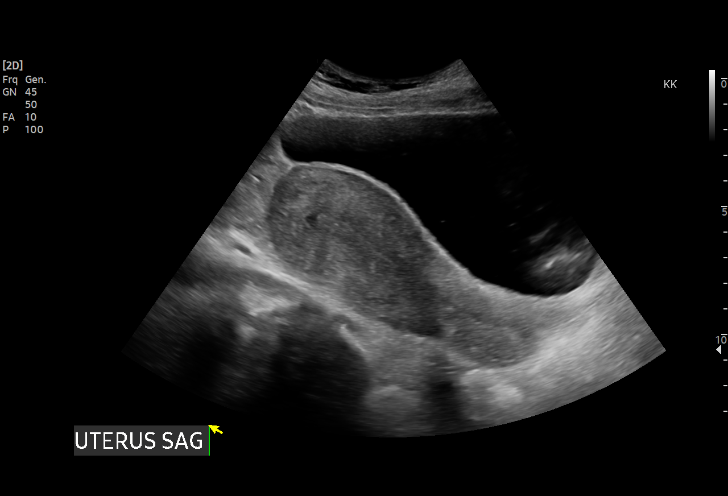
[im 11/126]
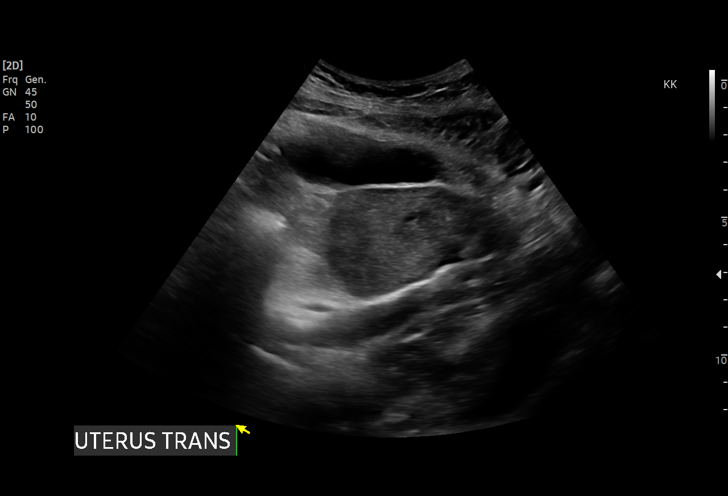
[im 21/126]
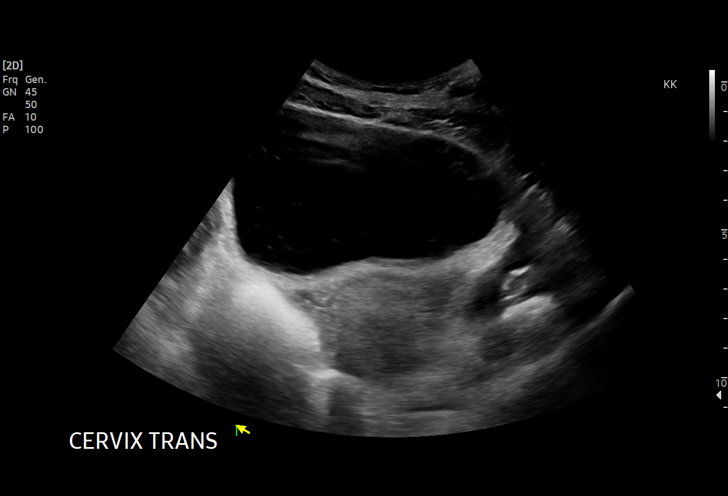
[im 27/126]
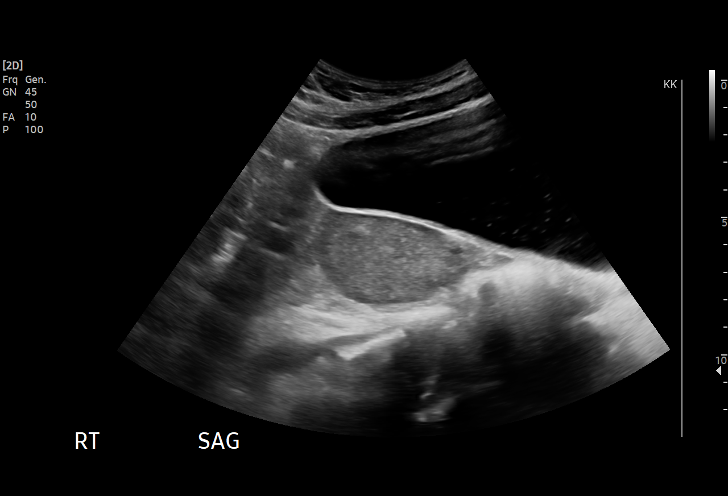
[im 37/126]
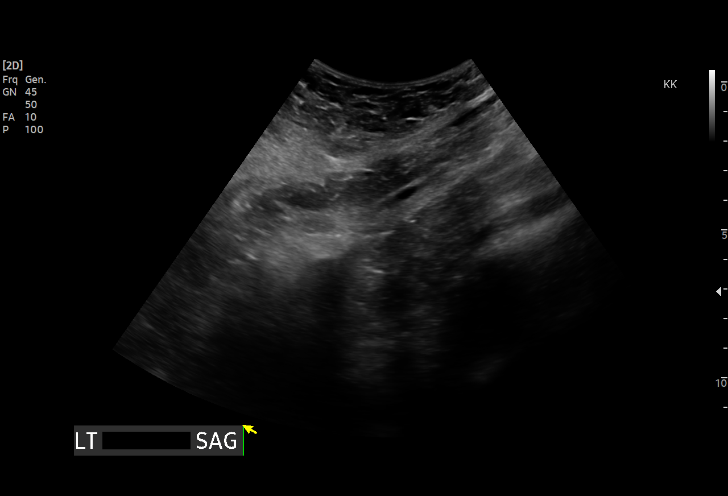
[im 47/126]
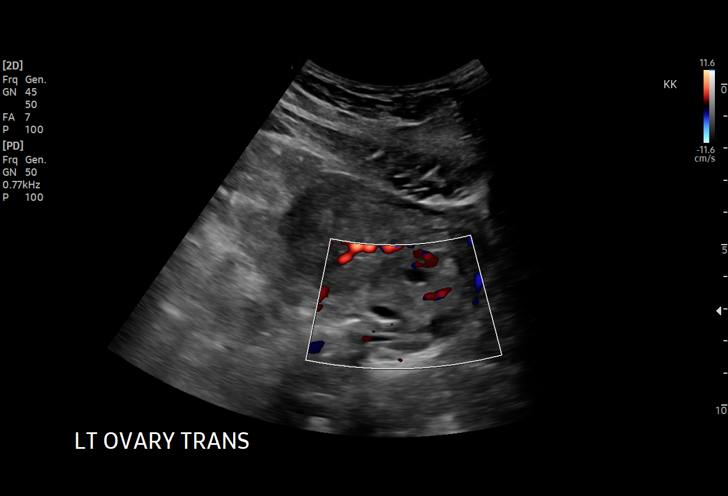
[im 53/126]
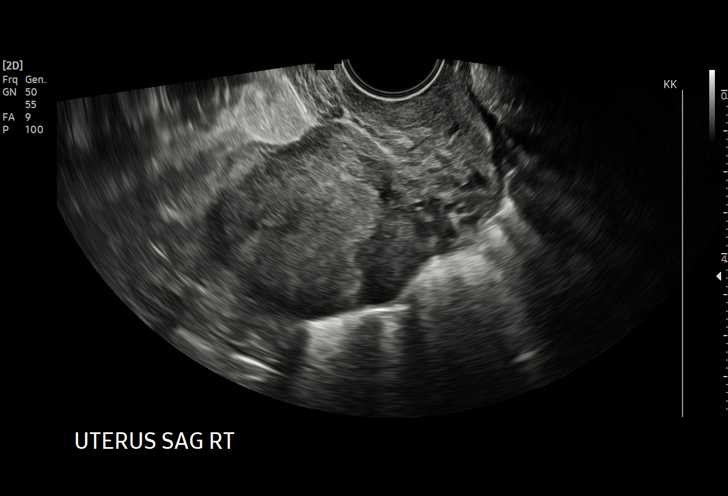
[im 63/126]
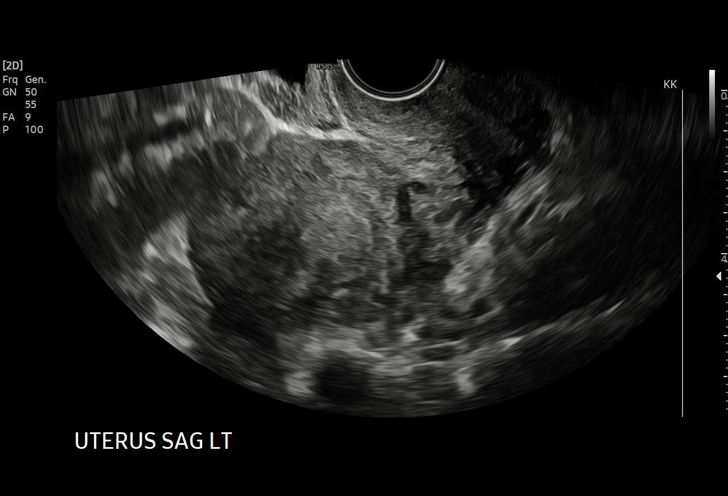
[im 73/126]
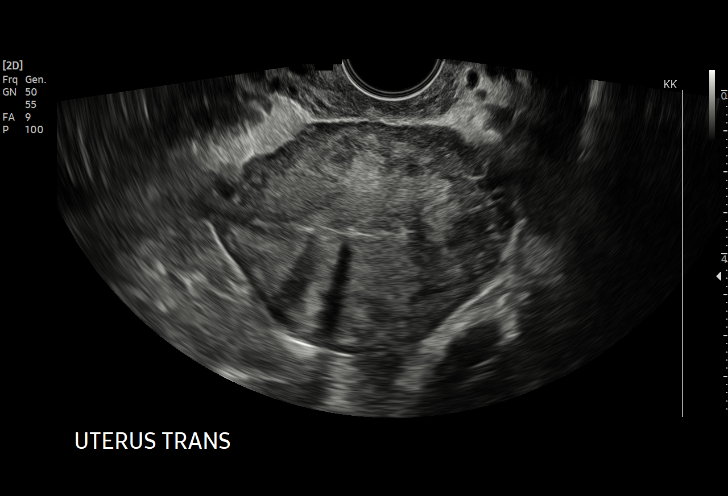
[im 79/126]
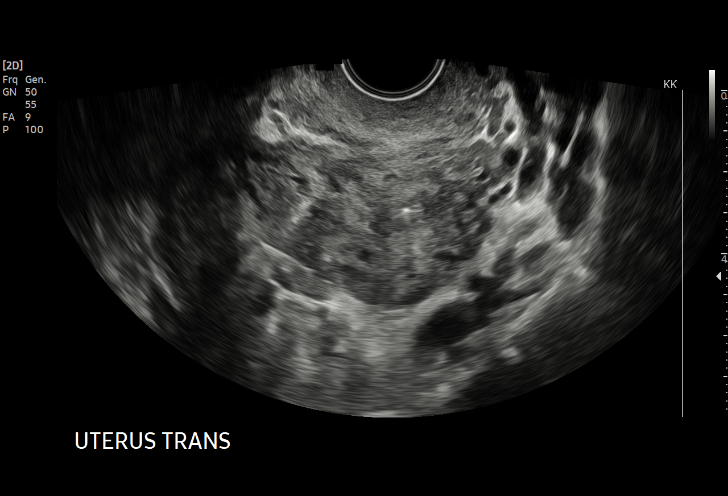
[im 89/126]
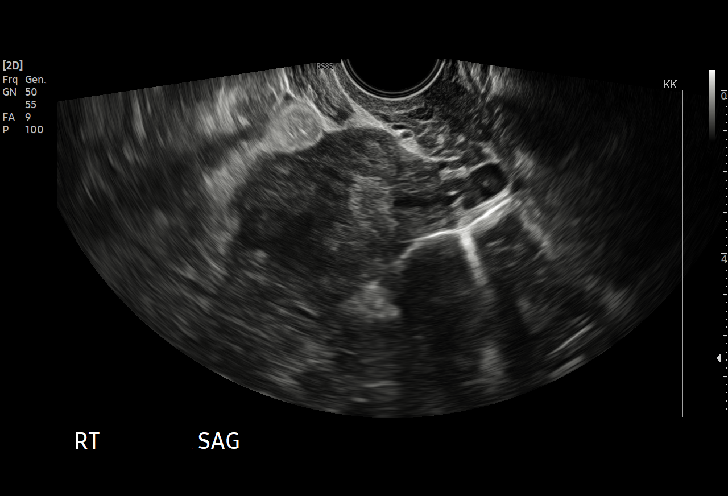
[im 99/126]
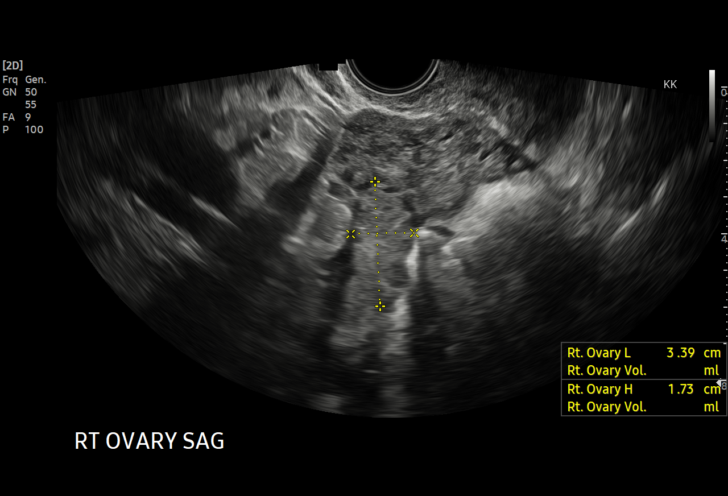
[im 105/126]
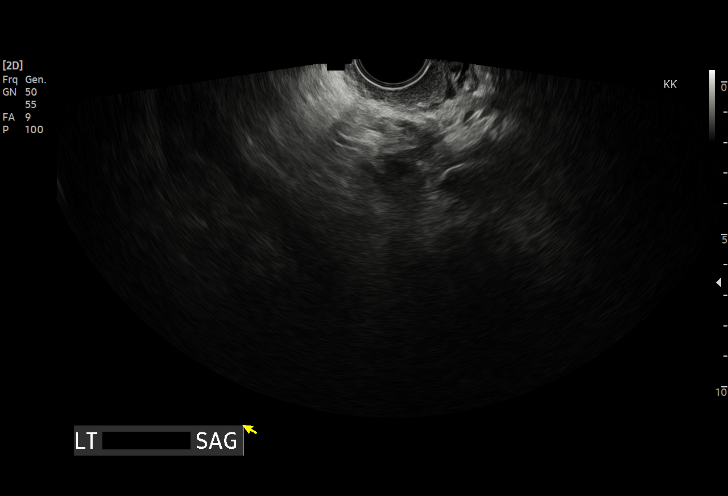
[im 115/126]
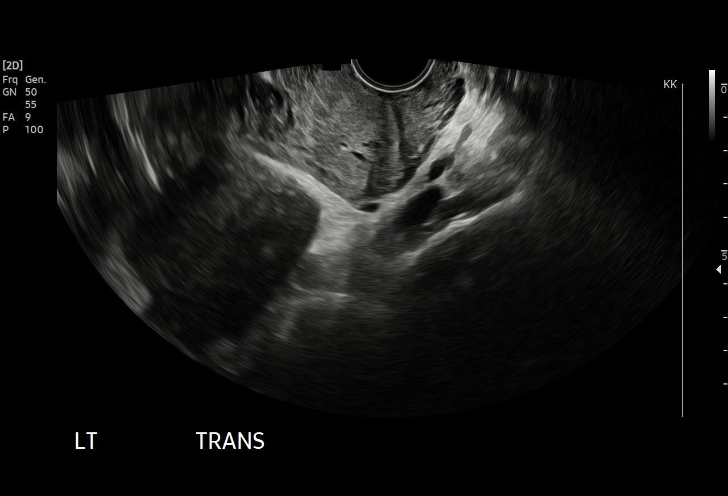
[im 126/126]
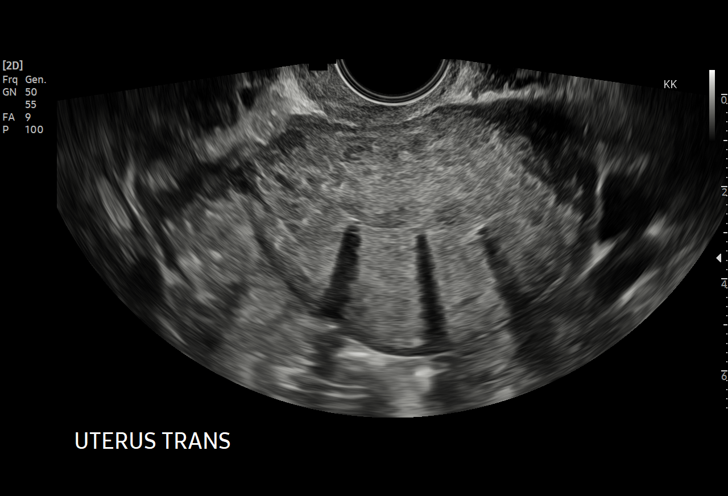

[15 of 25 positions shown; findings below may reference images not displayed]

FINDINGS: Uterus

Measurements: 12.1 x 5.6 x 7.3 cm = volume: 260 mL. Anteverted.
Normal morphology without mass

Endometrium

Thickness: 10 mm thick. IUD in expected position at upper uterine
segment endometrial canal. With no endometrial fluid or mass.

Right ovary

Measurements: 3.4 x 1.7 x 1.7 cm = volume: 5.1 mL. Normal morphology
without mass

Left ovary

Measurements: 2.0 x 2.2 x 1.6 cm = volume: 3.6 mL. Normal morphology
without mass

Other findings

Trace free pelvic fluid.  No adnexal masses.
IMPRESSION: IUD in expected position at upper uterine segment endometrial canal.

Remainder of exam normal.

## 2023-12-05 ENCOUNTER — Other Ambulatory Visit: Payer: Self-pay | Admitting: Obstetrics and Gynecology

## 2023-12-05 DIAGNOSIS — Z1231 Encounter for screening mammogram for malignant neoplasm of breast: Secondary | ICD-10-CM

## 2023-12-24 ENCOUNTER — Ambulatory Visit
Admission: RE | Admit: 2023-12-24 | Discharge: 2023-12-24 | Disposition: A | Discharge status: Home / Self Care | Source: Ambulatory Visit | Attending: Obstetrics and Gynecology | Admitting: Obstetrics and Gynecology

## 2023-12-24 DIAGNOSIS — Z1231 Encounter for screening mammogram for malignant neoplasm of breast: Secondary | ICD-10-CM

## 2023-12-31 ENCOUNTER — Other Ambulatory Visit: Payer: Self-pay | Admitting: Obstetrics and Gynecology

## 2023-12-31 DIAGNOSIS — R928 Other abnormal and inconclusive findings on diagnostic imaging of breast: Secondary | ICD-10-CM

## 2024-01-11 ENCOUNTER — Ambulatory Visit
Admission: RE | Admit: 2024-01-11 | Discharge: 2024-01-11 | Disposition: A | Discharge status: Home / Self Care | Source: Ambulatory Visit | Attending: Obstetrics and Gynecology | Admitting: Obstetrics and Gynecology

## 2024-01-11 DIAGNOSIS — R928 Other abnormal and inconclusive findings on diagnostic imaging of breast: Secondary | ICD-10-CM
